# Patient Record
Sex: Female | Born: 1935 | Race: White | Hispanic: No | Marital: Married | Smoking: Never smoker
Health system: Southern US, Community
[De-identification: ages and names within clinical notes are randomized; demographics above are authoritative.]

## PROBLEM LIST (undated history)

## (undated) DIAGNOSIS — N6009 Solitary cyst of unspecified breast: Secondary | ICD-10-CM

## (undated) DIAGNOSIS — B029 Zoster without complications: Secondary | ICD-10-CM

## (undated) DIAGNOSIS — M810 Age-related osteoporosis without current pathological fracture: Secondary | ICD-10-CM

## (undated) DIAGNOSIS — M1990 Unspecified osteoarthritis, unspecified site: Secondary | ICD-10-CM

## (undated) DIAGNOSIS — G8929 Other chronic pain: Secondary | ICD-10-CM

## (undated) DIAGNOSIS — G629 Polyneuropathy, unspecified: Secondary | ICD-10-CM

## (undated) DIAGNOSIS — I10 Essential (primary) hypertension: Secondary | ICD-10-CM

## (undated) DIAGNOSIS — E785 Hyperlipidemia, unspecified: Secondary | ICD-10-CM

## (undated) DIAGNOSIS — M545 Low back pain: Secondary | ICD-10-CM

## (undated) DIAGNOSIS — F419 Anxiety disorder, unspecified: Secondary | ICD-10-CM

## (undated) DIAGNOSIS — N189 Chronic kidney disease, unspecified: Secondary | ICD-10-CM

## (undated) DIAGNOSIS — N183 Chronic kidney disease, stage 3 (moderate): Secondary | ICD-10-CM

## (undated) HISTORY — DX: Chronic kidney disease, stage 3 (moderate): N18.3

## (undated) HISTORY — DX: Other chronic pain: G89.29

## (undated) HISTORY — PX: LAMINECTOMY: SHX219

## (undated) HISTORY — PX: OTHER SURGICAL HISTORY: SHX169

## (undated) HISTORY — DX: Low back pain: M54.5

## (undated) HISTORY — DX: Solitary cyst of unspecified breast: N60.09

## (undated) HISTORY — PX: TOTAL VAGINAL HYSTERECTOMY: SHX2548

## (undated) HISTORY — DX: Zoster without complications: B02.9

## (undated) HISTORY — DX: Age-related osteoporosis without current pathological fracture: M81.0

## (undated) HISTORY — DX: Unspecified osteoarthritis, unspecified site: M19.90

## (undated) HISTORY — PX: CHOLECYSTECTOMY: SHX55

---

## 2003-08-12 LAB — HM COLONOSCOPY: HM COLON: NORMAL

## 2004-07-15 ENCOUNTER — Ambulatory Visit: Payer: Self-pay | Admitting: Unknown Physician Specialty

## 2004-10-17 ENCOUNTER — Ambulatory Visit: Payer: Self-pay | Admitting: General Practice

## 2004-10-28 ENCOUNTER — Ambulatory Visit: Payer: Self-pay | Admitting: General Practice

## 2009-09-13 ENCOUNTER — Ambulatory Visit: Payer: Self-pay | Admitting: Internal Medicine

## 2009-10-16 ENCOUNTER — Encounter: Admission: RE | Admit: 2009-10-16 | Discharge: 2009-10-16 | Payer: Self-pay | Admitting: Orthopedic Surgery

## 2009-10-29 ENCOUNTER — Encounter: Admission: RE | Admit: 2009-10-29 | Discharge: 2009-10-29 | Payer: Self-pay | Admitting: Orthopedic Surgery

## 2009-12-05 ENCOUNTER — Ambulatory Visit: Payer: Self-pay | Admitting: Unknown Physician Specialty

## 2012-10-05 LAB — HM MAMMOGRAPHY: HM MAMMO: NORMAL

## 2014-08-25 ENCOUNTER — Ambulatory Visit: Payer: Self-pay | Admitting: Physician Assistant

## 2014-11-29 LAB — BASIC METABOLIC PANEL
BUN: 29 mg/dL — AB (ref 4–21)
Creatinine: 1 mg/dL (ref <=1.1)

## 2014-11-29 LAB — LIPID PANEL
Cholesterol: 238 mg/dL — AB (ref 0–200)
HDL: 60 mg/dL (ref 35–70)
LDL Cholesterol: 154 mg/dL
Triglycerides: 121 mg/dL (ref 40–160)

## 2014-11-29 LAB — TSH: TSH: 2.4 u[IU]/mL (ref <=5.90)

## 2014-11-29 LAB — CBC AND DIFFERENTIAL: HEMOGLOBIN: 12.8 g/dL (ref 12.0–16.0)

## 2015-03-28 ENCOUNTER — Encounter: Payer: Self-pay | Admitting: Internal Medicine

## 2015-06-04 ENCOUNTER — Other Ambulatory Visit: Payer: Self-pay | Admitting: Internal Medicine

## 2015-06-04 ENCOUNTER — Encounter: Payer: Self-pay | Admitting: Internal Medicine

## 2015-06-04 DIAGNOSIS — G629 Polyneuropathy, unspecified: Secondary | ICD-10-CM

## 2015-06-04 DIAGNOSIS — M150 Primary generalized (osteo)arthritis: Secondary | ICD-10-CM

## 2015-06-04 DIAGNOSIS — I10 Essential (primary) hypertension: Secondary | ICD-10-CM | POA: Insufficient documentation

## 2015-06-04 DIAGNOSIS — M159 Polyosteoarthritis, unspecified: Secondary | ICD-10-CM | POA: Insufficient documentation

## 2015-06-04 DIAGNOSIS — M62838 Other muscle spasm: Secondary | ICD-10-CM | POA: Insufficient documentation

## 2015-06-04 DIAGNOSIS — E782 Mixed hyperlipidemia: Secondary | ICD-10-CM | POA: Insufficient documentation

## 2015-06-04 DIAGNOSIS — M778 Other enthesopathies, not elsewhere classified: Secondary | ICD-10-CM | POA: Insufficient documentation

## 2015-06-04 DIAGNOSIS — G8929 Other chronic pain: Secondary | ICD-10-CM

## 2015-06-04 HISTORY — DX: Polyneuropathy, unspecified: G62.9

## 2015-06-04 HISTORY — DX: Other chronic pain: G89.29

## 2015-06-10 ENCOUNTER — Ambulatory Visit (INDEPENDENT_AMBULATORY_CARE_PROVIDER_SITE_OTHER): Payer: Medicare Other | Admitting: Internal Medicine

## 2015-06-10 ENCOUNTER — Ambulatory Visit
Admission: RE | Admit: 2015-06-10 | Discharge: 2015-06-10 | Disposition: A | Discharge status: Home / Self Care | Payer: Medicare Other | Source: Ambulatory Visit | Attending: Internal Medicine | Admitting: Internal Medicine

## 2015-06-10 ENCOUNTER — Encounter: Payer: Self-pay | Admitting: Internal Medicine

## 2015-06-10 VITALS — BP 120/64 | HR 64 | Ht 65.0 in | Wt 211.6 lb

## 2015-06-10 DIAGNOSIS — M25512 Pain in left shoulder: Principal | ICD-10-CM

## 2015-06-10 DIAGNOSIS — M25511 Pain in right shoulder: Secondary | ICD-10-CM | POA: Diagnosis not present

## 2015-06-10 DIAGNOSIS — Z23 Encounter for immunization: Secondary | ICD-10-CM

## 2015-06-10 DIAGNOSIS — G5601 Carpal tunnel syndrome, right upper limb: Secondary | ICD-10-CM

## 2015-06-10 MED ORDER — METHYLPREDNISOLONE 4 MG PO TBPK: ORAL_TABLET | ORAL | Status: DC

## 2015-06-10 NOTE — Patient Instructions (Signed)
May take Aleve twice a day  And supplement with Tylenol 500 mg three times day

## 2015-06-10 NOTE — Progress Notes (Signed)
Date:  06/10/2015   [facility]   DOB:  1930-09-08   MRN:  [id number]   Chief Complaint: Arthritis  Patient complains of bilateral shoulder pain. For 5 months ago she began to have pain in the left shoulder without a known injury she did have some stiffness in her neck which has improved. Now she has pain in the right shoulder for the past month, again with no injury. She's been taking Aleve to 20 mg once a day without much benefit. She also complains of numbness in her right hand that wakes her from sleep. Her hand also becomes numb while driving a car. She denies any weakness or injury. No history of carpal tunnel syndrome.  Review of Systems  Constitutional: Negative for fever and fatigue.  HENT: Negative for sore throat.   Respiratory: Negative for cough and shortness of breath.   Cardiovascular: Negative for chest pain and palpitations.  Gastrointestinal: Negative for abdominal pain.  Musculoskeletal: Positive for joint swelling, arthralgias and neck stiffness. Negative for gait problem.  Neurological: Positive for numbness (in right hand). Negative for tremors, weakness, light-headedness and headaches.  Hematological: Negative for adenopathy.  Psychiatric/Behavioral: Negative for sleep disturbance and dysphoric mood.    Patient Active Problem List   Diagnosis Date Noted  . Chronic pain 06/04/2015  . Essential (primary) hypertension 06/04/2015  . Combined fat and carbohydrate induced hyperlipemia 06/04/2015  . Muscle spasms of head and/or neck 06/04/2015  . Neuropathy (HCC) 06/04/2015  . Generalized OA 06/04/2015  . Tendinitis of wrist 06/04/2015    Prior to Admission medications   Medication Sig Start Date End Date Taking? Authorizing Provider  Alpha-Lipoic Acid 300 MG TABS Take by mouth.   Yes Historical Provider, MD  Calcium Carb-Cholecalciferol (CALCIUM PLUS VITAMIN D3) 600-500 MG-UNIT CAPS Take by mouth.   Yes Historical Provider, MD  Coenzyme Q10  (COQ10) 200 MG CAPS Take by mouth.   Yes Historical Provider, MD  diclofenac sodium (VOLTAREN) 1 % GEL Place onto the skin. 10/05/14  Yes Historical Provider, MD  lisinopril-hydrochlorothiazide (PRINZIDE,ZESTORETIC) 20-25 MG tablet Take 1 tablet by mouth daily. 12/05/14  Yes Historical Provider, MD  naproxen sodium (ALEVE) 220 MG tablet Take 1 tablet by mouth 2 (two) times daily.   Yes Historical Provider, MD  Omega-3 Fatty Acids (FISH OIL BURP-LESS) 1000 MG CAPS Take 1 capsule by mouth daily.   Yes Historical Provider, MD  cyclobenzaprine (FLEXERIL) 5 MG tablet Take 1 tablet by mouth at bedtime. 05/11/14   Historical Provider, MD    Allergies  Allergen Reactions  . Celecoxib Swelling  . Pregabalin     Other reaction(s): Dizziness    Past Surgical History  Procedure Laterality Date  . Cholecystectomy    . Laminectomy    . Total vaginal hysterectomy    . Breast biopsy Left     Social History  Substance Use Topics  . Smoking status: Never Smoker   . Smokeless tobacco: None  . Alcohol Use: None     Medication list has been reviewed and updated.   Physical Exam  Constitutional: She is oriented to person, place, and time. She appears well-developed. No distress.  HENT:  Head: Normocephalic and atraumatic.  Eyes: Conjunctivae are normal. Right eye exhibits no discharge. Left eye exhibits no discharge. No scleral icterus.  Neck: Normal range of motion.  Cardiovascular: Normal rate, regular rhythm and normal heart sounds.   Pulmonary/Chest: Effort normal and breath sounds normal. No respiratory distress. She has no wheezes.  Musculoskeletal: She exhibits no edema.       Right shoulder: She exhibits decreased range of motion and tenderness. She exhibits no swelling, no effusion and no crepitus.       Left shoulder: She exhibits decreased range of motion and tenderness. She exhibits no bony tenderness, no swelling, no effusion and no crepitus.       Right wrist: Normal.  Tinel's and  Phalen's sign negative on the right  Neurological: She is alert and oriented to person, place, and time. She has normal strength and normal reflexes.  Skin: Skin is warm and dry. No rash noted.  Psychiatric: She has a normal mood and affect. Her behavior is normal. Thought content normal.  Nursing note and vitals reviewed.   BP 120/64 mmHg  Pulse 64  Ht  (1.651 m)  Wt 211 lb 9.6 oz (95.981 kg)  BMI 35.21 kg/m2  Assessment and Plan: 1. Shoulder pain, bilateral Suspect primarily muscular but will get x-rays to rule out joint abnormality If no response to prednisone taper would refer to orthopedics - DG Shoulder Right; Future - DG Shoulder Left; Future - methylPREDNISolone (MEDROL DOSEPAK) 4 MG TBPK tablet; Take 6 pills on day 1 the 5 pills day 2 then 4 pills day 3 then 3 pills day 4 then 2 pills day 5 then one pills day 6 then stop  Dispense: 21 tablet; Refill: 0  2. Carpal tunnel syndrome on right May benefit from prednisone taper - methylPREDNISolone (MEDROL DOSEPAK) 4 MG TBPK tablet; Take 6 pills on day 1 the 5 pills day 2 then 4 pills day 3 then 3 pills day 4 then 2 pills day 5 then one pills day 6 then stop  Dispense: 21 tablet; Refill: 0  3. Flu vaccine need - Flu Vaccine QUAD 36+ mos PF IM (Fluarix & Fluzone Quad PF)   Bari Edward, MD [facility]  06/10/2015

## 2015-12-16 ENCOUNTER — Other Ambulatory Visit: Payer: Self-pay | Admitting: Internal Medicine

## 2015-12-16 NOTE — Telephone Encounter (Signed)
Pt is scheduled for May 11 @ 930

## 2015-12-19 ENCOUNTER — Other Ambulatory Visit: Payer: Self-pay

## 2015-12-19 ENCOUNTER — Encounter: Payer: Self-pay | Admitting: Internal Medicine

## 2015-12-19 ENCOUNTER — Ambulatory Visit (INDEPENDENT_AMBULATORY_CARE_PROVIDER_SITE_OTHER): Payer: Medicare Other | Admitting: Internal Medicine

## 2015-12-19 VITALS — BP 130/90 | HR 59 | Temp 98.3°F | Resp 16 | Ht 65.0 in | Wt 209.0 lb

## 2015-12-19 DIAGNOSIS — G629 Polyneuropathy, unspecified: Secondary | ICD-10-CM | POA: Diagnosis not present

## 2015-12-19 DIAGNOSIS — Z0000 Encounter for general adult medical examination without abnormal findings: Secondary | ICD-10-CM | POA: Diagnosis not present

## 2015-12-19 DIAGNOSIS — M159 Polyosteoarthritis, unspecified: Secondary | ICD-10-CM

## 2015-12-19 DIAGNOSIS — E782 Mixed hyperlipidemia: Secondary | ICD-10-CM

## 2015-12-19 DIAGNOSIS — E2839 Other primary ovarian failure: Secondary | ICD-10-CM | POA: Diagnosis not present

## 2015-12-19 DIAGNOSIS — I10 Essential (primary) hypertension: Secondary | ICD-10-CM

## 2015-12-19 LAB — POCT URINALYSIS DIPSTICK
Bilirubin, UA: NEGATIVE
GLUCOSE UA: NEGATIVE
Ketones, UA: NEGATIVE
NITRITE UA: POSITIVE
PROTEIN UA: NEGATIVE
RBC UA: NEGATIVE
Spec Grav, UA: 1.02
UROBILINOGEN UA: 0.2
pH, UA: 6

## 2015-12-19 NOTE — Progress Notes (Signed)
Patient: Sherry Morales, Female    DOB: [birth year], 82 y.o.   MRN: [id number] Visit Date: 12/19/2015  Today's Provider: Bari Edward, MD   Chief Complaint  Patient presents with  . Medicare Wellness   Subjective:    Annual wellness visit Sherry Morales is a 82 y.o. female who presents today for her Subsequent Annual Wellness Visit. She feels fairly well. She reports exercising none. She reports she is sleeping fairly well.   ----------------------------------------------------------- Hypertension This is a chronic problem. The current episode started more than 1 year ago. The problem is unchanged. The problem is controlled. Pertinent negatives include no chest pain, headaches, palpitations or shortness of breath.  Arthritis Knee and hands - she is having more problems with her hands and her left knee.  She is considering artificial joint fluid injections.  She continues on tylenol and aleve as needed.  Review of Systems  Constitutional: Negative for fever, chills and fatigue.  HENT: Positive for hearing loss. Negative for congestion, tinnitus, trouble swallowing and voice change.   Eyes: Negative for visual disturbance.  Respiratory: Negative for cough, chest tightness, shortness of breath and wheezing.   Cardiovascular: Negative for chest pain, palpitations and leg swelling.  Gastrointestinal: Negative for vomiting, abdominal pain, diarrhea and constipation.  Endocrine: Negative for polydipsia and polyuria.  Genitourinary: Negative for dysuria, frequency, hematuria, vaginal bleeding, vaginal discharge and genital sores.  Musculoskeletal: Positive for myalgias, arthralgias and gait problem. Negative for joint swelling.  Skin: Negative for color change and rash.  Allergic/Immunologic: Positive for environmental allergies. Negative for food allergies.  Neurological: Negative for dizziness, tremors, light-headedness and headaches.  Hematological: Negative for  adenopathy. Does not bruise/bleed easily.  Psychiatric/Behavioral: Negative for sleep disturbance and dysphoric mood. The patient is not nervous/anxious.     Social History   Social History  . Marital Status: Married    Spouse Name: N/A  . Number of Children: N/A  . Years of Education: N/A   Occupational History  . Not on file.   Social History Main Topics  . Smoking status: Never Smoker   . Smokeless tobacco: Not on file  . Alcohol Use: Not on file  . Drug Use: Not on file  . Sexual Activity: Not on file   Other Topics Concern  . Not on file   Social History Narrative    Patient Active Problem List   Diagnosis Date Noted  . Chronic pain 06/04/2015  . Essential (primary) hypertension 06/04/2015  . Combined fat and carbohydrate induced hyperlipemia 06/04/2015  . Muscle spasms of head and/or neck 06/04/2015  . Neuropathy (HCC) 06/04/2015  . Generalized OA 06/04/2015  . Tendinitis of wrist 06/04/2015    Past Surgical History  Procedure Laterality Date  . Cholecystectomy    . Laminectomy    . Total vaginal hysterectomy    . Breast biopsy Left     Her family history includes CAD in her father; Diabetes in her sister.    Previous Medications   ALPHA-LIPOIC ACID 300 MG TABS    Take by mouth.   CALCIUM CARB-CHOLECALCIFEROL (CALCIUM PLUS VITAMIN D3) 600-500 MG-UNIT CAPS    Take by mouth.   COENZYME Q10 (COQ10) 200 MG CAPS    Take by mouth.   DICLOFENAC SODIUM (VOLTAREN) 1 % GEL    Place onto the skin.   LISINOPRIL-HYDROCHLOROTHIAZIDE (PRINZIDE,ZESTORETIC) 20-25 MG TABLET    TAKE ONE TABLET BY MOUTH ONCE DAILY   MAGNESIUM 200 MG TABS    Take by mouth.  NAPROXEN SODIUM (ALEVE) 220 MG TABLET    Take 1 tablet by mouth daily.    OMEGA-3 FATTY ACIDS (FISH OIL BURP-LESS) 1000 MG CAPS    Take 1 capsule by mouth daily.    Patient Care Team: Reubin MilanLaura H Treyvonne Tata, MD as PCP - General (Family Medicine)     Objective:   Vitals: BP 130/90 mmHg  Pulse 59  Temp(Src) 98.3 F  (36.8 C)  Resp 16  Ht 5\' 5"  (1.651 m)  Wt 209 lb (94.802 kg)  BMI 34.78 kg/m2  SpO2 95%  Physical Exam  Constitutional: She is oriented to person, place, and time. She appears well-developed and well-nourished. No distress.  HENT:  Head: Normocephalic and atraumatic.  Right Ear: Tympanic membrane and ear canal normal.  Left Ear: Tympanic membrane and ear canal normal.  Nose: Right sinus exhibits no maxillary sinus tenderness. Left sinus exhibits no maxillary sinus tenderness.  Mouth/Throat: Uvula is midline and oropharynx is clear and moist.  Eyes: Conjunctivae and EOM are normal. Right eye exhibits no discharge. Left eye exhibits no discharge. No scleral icterus.  Neck: Normal range of motion. Carotid bruit is not present. No erythema present. No thyromegaly present.  Cardiovascular: Normal rate, regular rhythm, normal heart sounds and normal pulses.   Pulmonary/Chest: Effort normal. No respiratory distress. She has no wheezes. Right breast exhibits no mass, no nipple discharge, no skin change and no tenderness. Left breast exhibits no mass, no nipple discharge, no skin change and no tenderness.  Abdominal: Soft. Bowel sounds are normal. There is no hepatosplenomegaly. There is no tenderness. There is no CVA tenderness.  Musculoskeletal:       Right knee: She exhibits decreased range of motion and swelling.       Left knee: She exhibits decreased range of motion and swelling.  Lymphadenopathy:    She has no cervical adenopathy.    She has no axillary adenopathy.  Neurological: She is alert and oriented to person, place, and time. She has normal reflexes. No cranial nerve deficit or sensory deficit.  Skin: Skin is warm, dry and intact. No rash noted.  Scattered echymoses  Psychiatric: She has a normal mood and affect. Her speech is normal and behavior is normal. Thought content normal.  Nursing note and vitals reviewed.   Activities of Daily Living In your present state of health,  do you have any difficulty performing the following activities: 12/19/2015 06/10/2015  Hearing? N N  Vision? N N  Difficulty concentrating or making decisions? N N  Walking or climbing stairs? Y Y  Dressing or bathing? N Y  Doing errands, shopping? N N  Preparing Food and eating ? N -  Using the Toilet? N -  In the past six months, have you accidently leaked urine? N -  Do you have problems with loss of bowel control? N -  Managing your Medications? N -  Managing your Finances? N -  Housekeeping or managing your Housekeeping? N -    Fall Risk Assessment Fall Risk  06/10/2015  Falls in the past year? No      Depression Screen PHQ 2/9 Scores 12/19/2015 06/10/2015  PHQ - 2 Score 2 1  PHQ- 9 Score 10 -    Cognitive Testing - 6-CIT   Correct? Score   What year is it? yes 0 Yes = 0    No = 4  What month is it? yes 0 Yes = 0    No = 3  Remember:  Floyde Parkins, [address], [place]     What time is it? yes 0 Yes = 0    No = 3  Count backwards from 20 to 1 yes 0 Correct = 0    1 error = 2   More than 1 error = 4  Say the months of the year in reverse. yes 0 Correct = 0    1 error = 2   More than 1 error = 4  What address did I ask you to remember? yes 0 Correct = 0  1 error = 2    2 error = 4    3 error = 6    4 error = 8    All wrong = 10       TOTAL SCORE  0/28   Interpretation:  Normal  Normal (0-7) Abnormal (8-28)        Medicare Annual Wellness Visit Summary:  Reviewed patient's Family Medical History Reviewed and updated list of patient's medical providers Assessment of cognitive impairment was done Assessed patient's functional ability Established a written schedule for health screening services Health Risk Assessent Completed and Reviewed  Exercise Activities and Dietary recommendations Goals    None      Immunization History  Administered Date(s) Administered  . Influenza,inj,Quad PF,36+ Mos 06/10/2015  . Pneumococcal Conjugate-13 11/29/2014     Health Maintenance  Topic Date Due  . TETANUS/TDAP  04/05/1952  . ZOSTAVAX  04/05/1993  . DEXA SCAN  04/05/1998  . PNA vac Low Risk Adult (2 of 2 - PPSV23) 11/29/2015  . INFLUENZA VACCINE  03/10/2016     Discussed health benefits of physical activity, and encouraged her to engage in regular exercise appropriate for her age and condition.    ------------------------------------------------------------------------------------------------------------   Assessment & Plan:  1. Medicare annual wellness visit, subsequent Measures satisfied Depression scale discussed - she attributes her answers to her current degree of OA pain and debility - POCT urinalysis dipstick  2. Essential (primary) hypertension controlled - CBC with Differential/Platelet - Comprehensive metabolic panel  3. Neuropathy (HCC) Improved with use of alpha-lipoic acid - TSH  4. Generalized OA Continue medications Consult Ortho regarding joint injections  5. Combined fat and carbohydrate induced hyperlipemia Continue healthy diet - Lipid panel  6. Ovarian failure Continue calcium + Vitamin D - DG Bone Density; Future   Bari Edward, MD [facility]  12/19/2015

## 2015-12-19 NOTE — Patient Instructions (Signed)
Health Maintenance  Topic Date Due  . TETANUS/TDAP  04/05/1952  . DEXA SCAN  04/05/1998  . PNA vac Low Risk Adult (2 of 2 - PPSV23) 11/29/2015  . INFLUENZA VACCINE  03/10/2016  . MAMMOGRAM  03/24/2016  . ZOSTAVAX  Addressed   Breast Self-Awareness Practicing breast self-awareness may pick up problems early, prevent significant medical complications, and possibly save your life. By practicing breast self-awareness, you can become familiar with how your breasts look and feel and if your breasts are changing. This allows you to notice changes early. It can also offer you some reassurance that your breast health is good. One way to learn what is normal for your breasts and whether your breasts are changing is to do a breast self-exam. If you find a lump or something that was not present in the past, it is best to contact your caregiver right away. Other findings that should be evaluated by your caregiver include nipple discharge, especially if it is bloody; skin changes or reddening; areas where the skin seems to be pulled in (retracted); or new lumps and bumps. Breast pain is seldom associated with cancer (malignancy), but should also be evaluated by a caregiver. HOW TO PERFORM A BREAST SELF-EXAM The best time to examine your breasts is 5-7 days after your menstrual period is over. During menstruation, the breasts are lumpier, and it may be more difficult to pick up changes. If you do not menstruate, have reached menopause, or had your uterus removed (hysterectomy), you should examine your breasts at regular intervals, such as monthly. If you are breastfeeding, examine your breasts after a feeding or after using a breast pump. Breast implants do not decrease the risk for lumps or tumors, so continue to perform breast self-exams as recommended. Talk to your caregiver about how to determine the difference between the implant and breast tissue. Also, talk about the amount of pressure you should use during the  exam. Over time, you will become more familiar with the variations of your breasts and more comfortable with the exam. A breast self-exam requires you to remove all your clothes above the waist. 1. Look at your breasts and nipples. Stand in front of a mirror in a room with good lighting. With your hands on your hips, push your hands firmly downward. Look for a difference in shape, contour, and size from one breast to the other (asymmetry). Asymmetry includes puckers, dips, or bumps. Also, look for skin changes, such as reddened or scaly areas on the breasts. Look for nipple changes, such as discharge, dimpling, repositioning, or redness. 2. Carefully feel your breasts. This is best done either in the shower or tub while using soapy water or when flat on your back. Place the arm (on the side of the breast you are examining) above your head. Use the pads (not the fingertips) of your three middle fingers on your opposite hand to feel your breasts. Start in the underarm area and use  inch (2 cm) overlapping circles to feel your breast. Use 3 different levels of pressure (light, medium, and firm pressure) at each circle before moving to the next circle. The light pressure is needed to feel the tissue closest to the skin. The medium pressure will help to feel breast tissue a little deeper, while the firm pressure is needed to feel the tissue close to the ribs. Continue the overlapping circles, moving downward over the breast until you feel your ribs below your breast. Then, move one finger-width towards the  center of the body. Continue to use the  inch (2 cm) overlapping circles to feel your breast as you move slowly up toward the collar bone (clavicle) near the base of the neck. Continue the up and down exam using all 3 pressures until you reach the middle of the chest. Do this with each breast, carefully feeling for lumps or changes. 3.  Keep a written record with breast changes or normal findings for each breast.  By writing this information down, you do not need to depend only on memory for size, tenderness, or location. Write down where you are in your menstrual cycle, if you are still menstruating. Breast tissue can have some lumps or thick tissue. However, see your caregiver if you find anything that concerns you.  SEEK MEDICAL CARE IF:  You see a change in shape, contour, or size of your breasts or nipples.   You see skin changes, such as reddened or scaly areas on the breasts or nipples.   You have an unusual discharge from your nipples.   You feel a new lump or unusually thick areas.    This information is not intended to replace advice given to you by your health care provider. Make sure you discuss any questions you have with your health care provider.   Document Released: 07/27/2005 Document Revised: 07/13/2012 Document Reviewed: 11/11/2011 Elsevier Interactive Patient Education [employer].

## 2015-12-20 ENCOUNTER — Encounter: Payer: Self-pay | Admitting: Internal Medicine

## 2015-12-20 DIAGNOSIS — N183 Chronic kidney disease, stage 3 unspecified: Secondary | ICD-10-CM | POA: Insufficient documentation

## 2015-12-20 LAB — COMPREHENSIVE METABOLIC PANEL
ALBUMIN: 4.6 g/dL (ref 3.5–4.7)
ALT: 15 IU/L (ref 0–32)
AST: 25 IU/L (ref 0–40)
Albumin/Globulin Ratio: 1.9 (ref 1.2–2.2)
Alkaline Phosphatase: 99 IU/L (ref 39–117)
BUN / CREAT RATIO: 27 (ref 12–28)
BUN: 28 mg/dL — AB (ref 8–27)
Bilirubin Total: 0.4 mg/dL (ref 0.0–1.2)
CALCIUM: 9.3 mg/dL (ref 8.7–10.3)
CO2: 23 mmol/L (ref 18–29)
CREATININE: 1.03 mg/dL — AB (ref 0.57–1.00)
Chloride: 100 mmol/L (ref 96–106)
GFR, EST AFRICAN AMERICAN: 58 mL/min/{1.73_m2} — AB (ref >59)
GFR, EST NON AFRICAN AMERICAN: 51 mL/min/{1.73_m2} — AB (ref >59)
GLUCOSE: 86 mg/dL (ref 65–99)
Globulin, Total: 2.4 g/dL (ref 1.5–4.5)
Potassium: 4.3 mmol/L (ref 3.5–5.2)
Sodium: 141 mmol/L (ref 134–144)
TOTAL PROTEIN: 7 g/dL (ref 6.0–8.5)

## 2015-12-20 LAB — CBC WITH DIFFERENTIAL/PLATELET
BASOS ABS: 0 10*3/uL (ref 0.0–0.2)
Basos: 0 %
EOS (ABSOLUTE): 0.3 10*3/uL (ref 0.0–0.4)
Eos: 4 %
Hematocrit: 37.8 % (ref 34.0–46.6)
Hemoglobin: 12.1 g/dL (ref 11.1–15.9)
IMMATURE GRANULOCYTES: 0 %
Immature Grans (Abs): 0 10*3/uL (ref 0.0–0.1)
LYMPHS: 27 %
Lymphocytes Absolute: 1.9 10*3/uL (ref 0.7–3.1)
MCH: 27.9 pg (ref 26.6–33.0)
MCHC: 32 g/dL (ref 31.5–35.7)
MCV: 87 fL (ref 79–97)
MONOS ABS: 0.6 10*3/uL (ref 0.1–0.9)
Monocytes: 8 %
NEUTROS PCT: 61 %
Neutrophils Absolute: 4.3 10*3/uL (ref 1.4–7.0)
Platelets: 276 10*3/uL (ref 150–379)
RBC: 4.33 x10E6/uL (ref 3.77–5.28)
RDW: 13.8 % (ref 12.3–15.4)
WBC: 7 10*3/uL (ref 3.4–10.8)

## 2015-12-20 LAB — TSH: TSH: 3.52 u[IU]/mL (ref 0.450–4.500)

## 2015-12-20 LAB — LIPID PANEL
CHOL/HDL RATIO: 4.8 ratio — AB (ref 0.0–4.4)
Cholesterol, Total: 233 mg/dL — ABNORMAL HIGH (ref 100–199)
HDL: 49 mg/dL (ref >39)
LDL Calculated: 156 mg/dL — ABNORMAL HIGH (ref 0–99)
Triglycerides: 141 mg/dL (ref 0–149)
VLDL CHOLESTEROL CAL: 28 mg/dL (ref 5–40)

## 2016-06-03 ENCOUNTER — Ambulatory Visit (INDEPENDENT_AMBULATORY_CARE_PROVIDER_SITE_OTHER): Payer: Medicare Other

## 2016-06-03 DIAGNOSIS — Z23 Encounter for immunization: Secondary | ICD-10-CM

## 2016-06-22 ENCOUNTER — Other Ambulatory Visit: Payer: Self-pay | Admitting: Internal Medicine

## 2016-06-25 NOTE — Telephone Encounter (Signed)
pts coming in on 6/5 for cpe/maw

## 2016-11-17 IMAGING — CR DG SHOULDER 2+V*L*
3 series · 3 of 3 positions shown · non-contrast
Comparison: None.

CLINICAL DATA: Left shoulder pain.

EXAM:
LEFT SHOULDER - 2+ VIEW

[shoulder axial (1 of 3)]
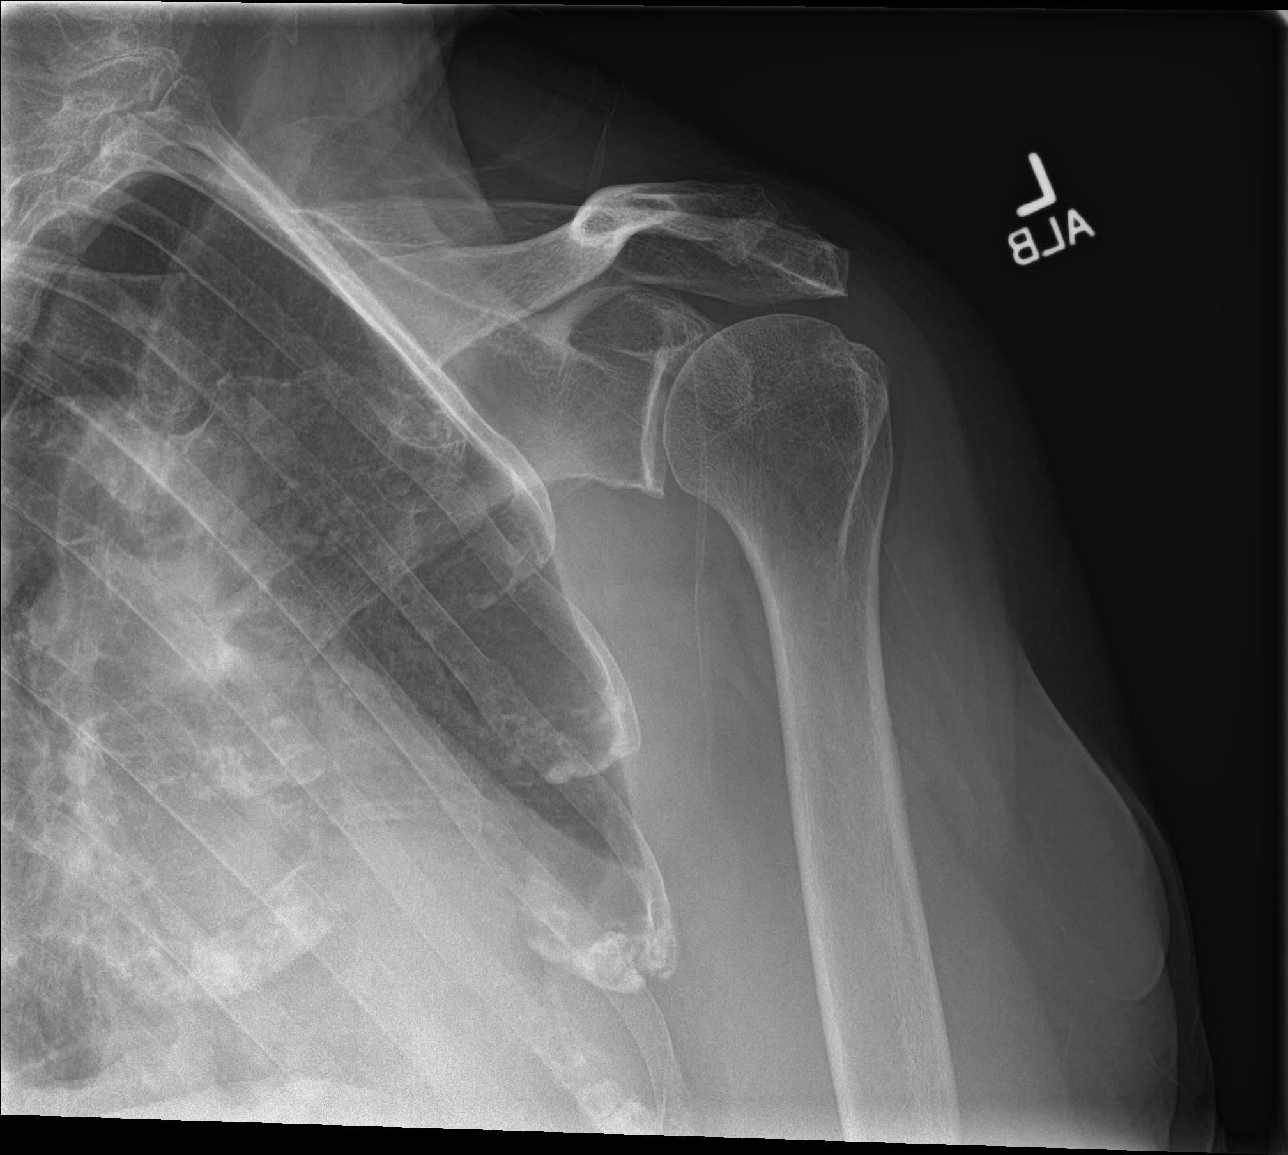

[shoulder axial (2 of 3)]
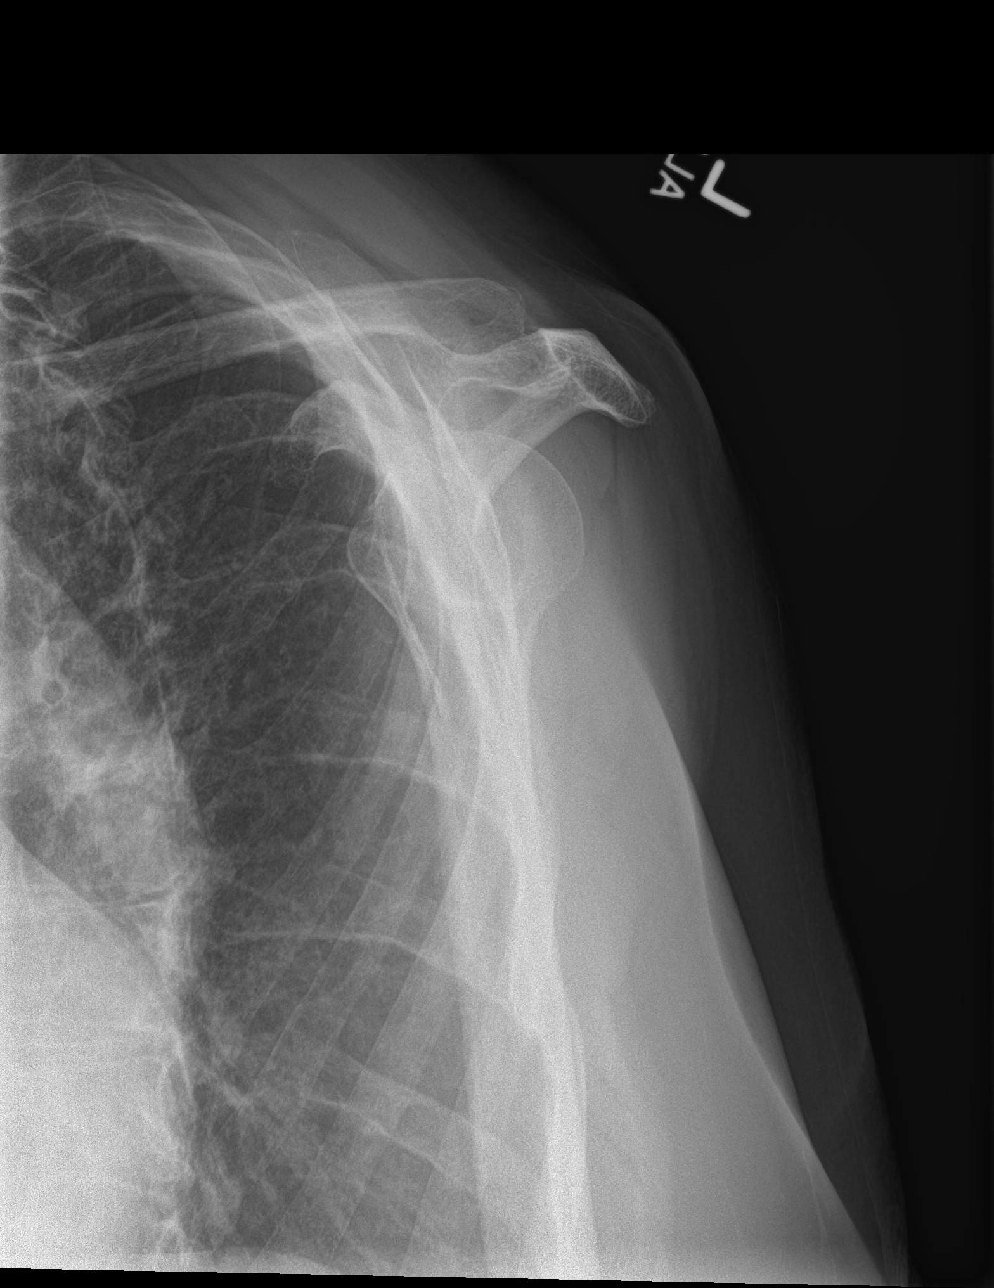

[shoulder axial (3 of 3)]
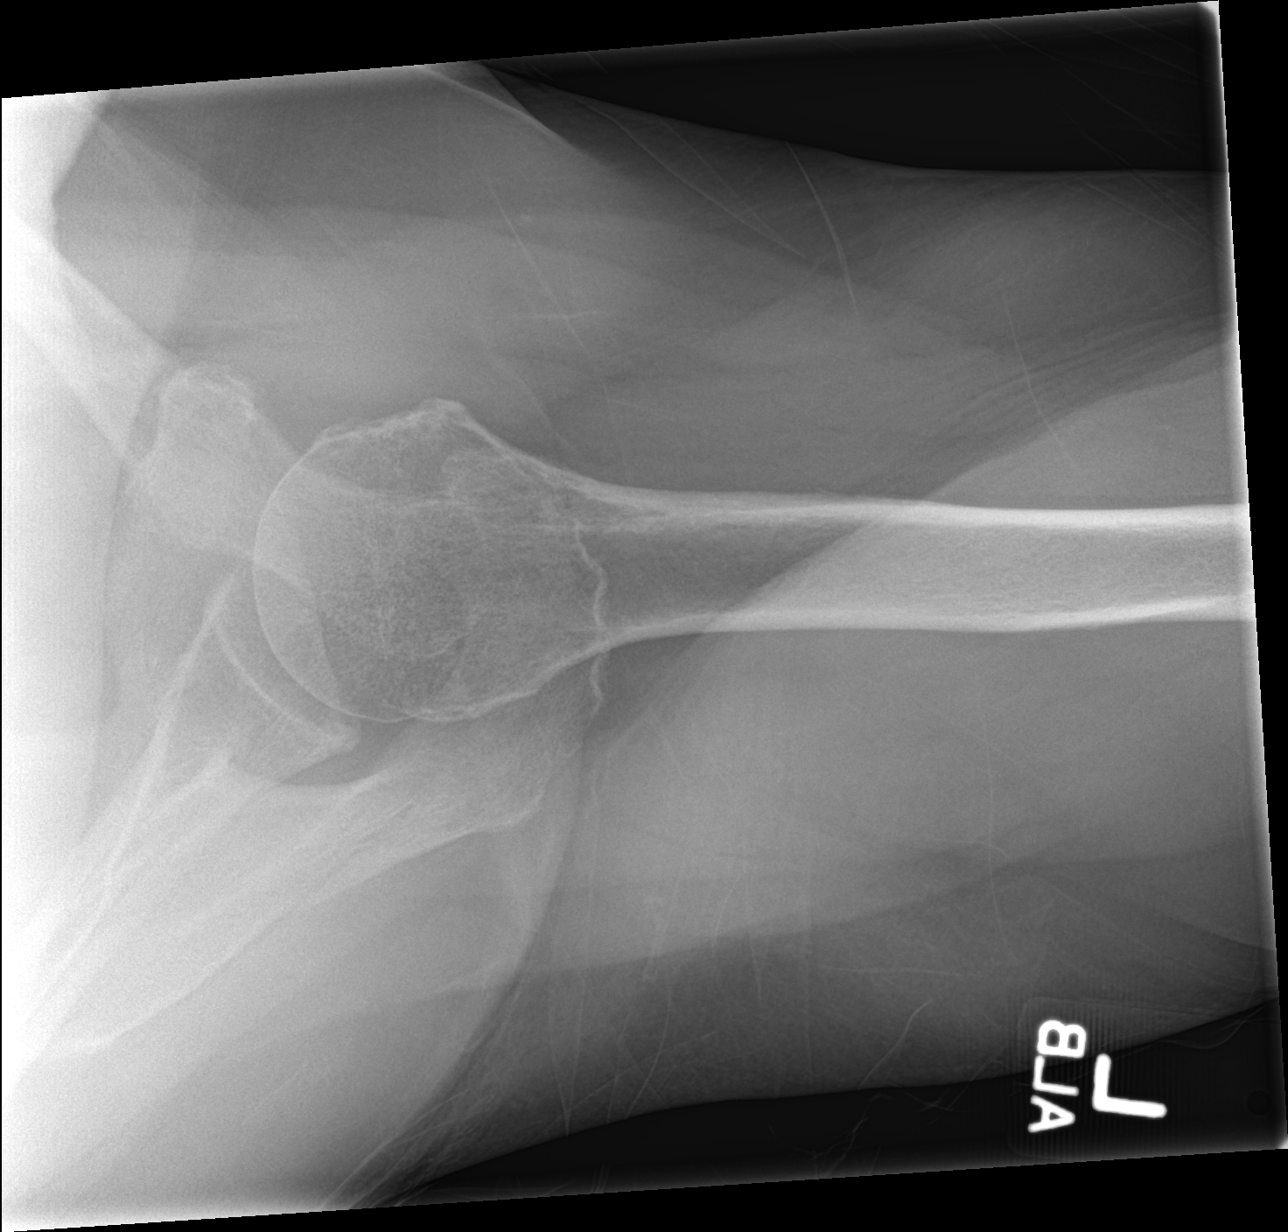

[3 of 3 positions shown; findings below may reference images not displayed]

FINDINGS: There is no evidence of fracture or dislocation. No bony lesions or
destruction. Mild degenerative changes are seen involving the AC
joint. Soft tissues are unremarkable.
IMPRESSION: No acute findings.  Mild degenerative disease of the AC joint.

## 2016-12-24 DIAGNOSIS — M545 Low back pain, unspecified: Secondary | ICD-10-CM

## 2016-12-24 DIAGNOSIS — N183 Chronic kidney disease, stage 3 unspecified: Secondary | ICD-10-CM

## 2016-12-24 HISTORY — DX: Chronic kidney disease, stage 3 unspecified: N18.30

## 2016-12-24 HISTORY — DX: Low back pain, unspecified: M54.50

## 2016-12-28 ENCOUNTER — Telehealth: Payer: Self-pay | Admitting: Internal Medicine

## 2016-12-28 NOTE — Telephone Encounter (Signed)
Called pt to schedule Annual Wellness Visit with Nurse Health Advisor for June:  - knb ° °

## 2017-01-01 ENCOUNTER — Other Ambulatory Visit: Payer: Self-pay | Admitting: Internal Medicine

## 2017-01-06 ENCOUNTER — Other Ambulatory Visit: Payer: Self-pay

## 2017-01-06 MED ORDER — LISINOPRIL-HYDROCHLOROTHIAZIDE 20-25 MG PO TABS: 1.0000 | ORAL_TABLET | Freq: Every day | ORAL | 0 refills | Status: DC

## 2017-01-12 ENCOUNTER — Ambulatory Visit (INDEPENDENT_AMBULATORY_CARE_PROVIDER_SITE_OTHER): Payer: Medicare Other | Admitting: Internal Medicine

## 2017-01-12 ENCOUNTER — Encounter: Payer: Self-pay | Admitting: Internal Medicine

## 2017-01-12 VITALS — BP 128/72 | HR 67 | Ht 65.0 in | Wt 206.0 lb

## 2017-01-12 DIAGNOSIS — E782 Mixed hyperlipidemia: Secondary | ICD-10-CM

## 2017-01-12 DIAGNOSIS — I10 Essential (primary) hypertension: Secondary | ICD-10-CM

## 2017-01-12 DIAGNOSIS — Z1239 Encounter for other screening for malignant neoplasm of breast: Secondary | ICD-10-CM

## 2017-01-12 DIAGNOSIS — N183 Chronic kidney disease, stage 3 unspecified: Secondary | ICD-10-CM

## 2017-01-12 DIAGNOSIS — Z0000 Encounter for general adult medical examination without abnormal findings: Secondary | ICD-10-CM

## 2017-01-12 DIAGNOSIS — M62838 Other muscle spasm: Secondary | ICD-10-CM | POA: Diagnosis not present

## 2017-01-12 LAB — POCT URINALYSIS DIPSTICK
Bilirubin, UA: NEGATIVE
Glucose, UA: NEGATIVE
Ketones, UA: NEGATIVE
Nitrite, UA: NEGATIVE
PH UA: 6 (ref 5.0–8.0)
Protein, UA: NEGATIVE
RBC UA: NEGATIVE
Spec Grav, UA: 1.015 (ref 1.010–1.025)
UROBILINOGEN UA: 0.2 U/dL

## 2017-01-12 MED ORDER — BACLOFEN 10 MG PO TABS: 10.0000 mg | ORAL_TABLET | Freq: Every day | ORAL | 0 refills | Status: DC

## 2017-01-12 MED ORDER — LISINOPRIL-HYDROCHLOROTHIAZIDE 20-25 MG PO TABS
1.0000 | ORAL_TABLET | Freq: Every day | ORAL | 3 refills | Status: DC
Start: 2017-01-12 — End: 2018-01-25

## 2017-01-12 NOTE — Patient Instructions (Signed)
Health Maintenance  Topic Date Due  . TETANUS/TDAP  04/05/1952  . DEXA SCAN  04/05/1998  . MAMMOGRAM  03/24/2016  . INFLUENZA VACCINE  03/10/2017  . PNA vac Low Risk Adult  Completed

## 2017-01-12 NOTE — Progress Notes (Signed)
Patient: Sherry Morales, Female    DOB: March 02, 1931, 83 y.o.   MRN: [id number] Visit Date: 01/12/2017  Today's Provider: Bari EdwardLaura Tamlyn Sides, MD   Chief Complaint  Patient presents with  . Medicare Wellness    Breast Exam.  . Hypertension   Subjective:    Annual wellness visit Sherry Morales is a 83 y.o. female who presents today for her Subsequent Annual Wellness Visit. She feels fairly well. She reports exercising none due to hip pain. She reports she is sleeping fairly well. [place]  ----------------------------------------------------------- Hypertension  This is a chronic problem. The problem is controlled. Pertinent negatives include no chest pain, headaches, palpitations or shortness of breath. Past treatments include ACE inhibitors and diuretics. The current treatment provides significant improvement. Hypertensive end-organ damage includes kidney disease.  Hip Pain   There was no injury mechanism. The pain is present in the right hip. The quality of the pain is described as aching and cramping. The pain is mild. The pain has been improving since onset. The symptoms are aggravated by weight bearing. The treatment provided moderate relief.  Seeing chiropractor for piriformis spasm and improving but would like to try a muscle relaxants as well.  Review of Systems  Constitutional: Negative for chills, fatigue, fever and unexpected weight change.  HENT: Negative for congestion, hearing loss, tinnitus, trouble swallowing and voice change.   Eyes: Negative for visual disturbance.  Respiratory: Negative for cough, chest tightness, shortness of breath and wheezing.   Cardiovascular: Negative for chest pain, palpitations and leg swelling.  Gastrointestinal: Negative for abdominal pain, constipation, diarrhea and vomiting.  Endocrine: Negative for polydipsia and polyuria.  Genitourinary: Negative for dysuria, frequency, genital sores, vaginal  bleeding and vaginal discharge.  Musculoskeletal: Positive for arthralgias and gait problem. Negative for joint swelling.  Skin: Negative for color change and rash.  Neurological: Negative for dizziness, tremors, light-headedness and headaches.  Hematological: Negative for adenopathy. Does not bruise/bleed easily.  Psychiatric/Behavioral: Negative for dysphoric mood and sleep disturbance. The patient is not nervous/anxious.     Social History   Social History  . Marital status: Married    Spouse name: N/A  . Number of children: N/A  . Years of education: N/A   Occupational History  . Not on file.   Social History Main Topics  . Smoking status: Never Smoker  . Smokeless tobacco: Never Used  . Alcohol use No  . Drug use: Unknown  . Sexual activity: Not on file   Other Topics Concern  . Not on file   Social History Narrative  . No narrative on file    Patient Active Problem List   Diagnosis Date Noted  . CKD (chronic kidney disease), stage III 12/20/2015  . Chronic pain 06/04/2015  . Essential (primary) hypertension 06/04/2015  . Combined fat and carbohydrate induced hyperlipemia 06/04/2015  . Muscle spasms of head and/or neck 06/04/2015  . Neuropathy 06/04/2015  . Generalized OA 06/04/2015  . Tendinitis of wrist 06/04/2015    Past Surgical History:  Procedure Laterality Date  . Breast biopsy Left   . CHOLECYSTECTOMY    . LAMINECTOMY    . TOTAL VAGINAL HYSTERECTOMY      Her family history includes CAD in her father; Diabetes in her sister.     Previous Medications   ALPHA-LIPOIC ACID 300 MG TABS    Take by mouth.   COENZYME Q10 (COQ10) 200 MG CAPS    Take by mouth.   LISINOPRIL-HYDROCHLOROTHIAZIDE (PRINZIDE,ZESTORETIC) 20-25  MG TABLET    Take 1 tablet by mouth daily.   MAGNESIUM 200 MG TABS    Take by mouth.   NAPROXEN SODIUM (ALEVE) 220 MG TABLET    Take 1 tablet by mouth daily.    OMEGA-3 FATTY ACIDS (FISH OIL BURP-LESS) 1000 MG CAPS    Take 1 capsule by  mouth daily.   S-ADENOSYLMETHIONINE (SAM-E) 400 MG TABS    Take 2 tablets by mouth daily.     Patient Care Team: Reubin Milan, MD as PCP - General (Family Medicine)      Objective:   Vitals: BP 128/72 (BP Location: Right Arm, Patient Position: Sitting, Cuff Size: Normal)   Pulse 67   Ht 5\' 5"  (1.651 m)   Wt 206 lb (93.4 kg)   SpO2 97%   BMI 34.28 kg/m   Physical Exam  Constitutional: She is oriented to person, place, and time. She appears well-developed and well-nourished. No distress.  HENT:  Head: Normocephalic and atraumatic.  Right Ear: Tympanic membrane and ear canal normal.  Left Ear: Tympanic membrane and ear canal normal.  Nose: Right sinus exhibits no maxillary sinus tenderness. Left sinus exhibits no maxillary sinus tenderness.  Mouth/Throat: Uvula is midline and oropharynx is clear and moist.  Eyes: Conjunctivae and EOM are normal. Right eye exhibits no discharge. Left eye exhibits no discharge. No scleral icterus.  Neck: Normal range of motion. Carotid bruit is not present. No erythema present. No thyromegaly present.  Cardiovascular: Normal rate, regular rhythm, normal heart sounds and normal pulses.   Pulmonary/Chest: Effort normal. No respiratory distress. She has no decreased breath sounds. She has no wheezes. Right breast exhibits no mass, no nipple discharge, no skin change and no tenderness. Left breast exhibits skin change (bruise inner lower quadrant). Left breast exhibits no mass, no nipple discharge and no tenderness.  Abdominal: Soft. Bowel sounds are normal. There is no hepatosplenomegaly. There is no tenderness. There is no CVA tenderness.  Musculoskeletal:       Right hip: She exhibits decreased range of motion.       Lumbar back: She exhibits tenderness and spasm.  Lymphadenopathy:    She has no cervical adenopathy.    She has no axillary adenopathy.  Neurological: She is alert and oriented to person, place, and time. She has normal reflexes. No  cranial nerve deficit or sensory deficit.  Skin: Skin is warm, dry and intact. No rash noted.  Psychiatric: She has a normal mood and affect. Her speech is normal and behavior is normal. Thought content normal.  Nursing note and vitals reviewed.   Activities of Daily Living In your present state of health, do you have any difficulty performing the following activities: 01/12/2017  Hearing? N  Vision? N  Difficulty concentrating or making decisions? N  Walking or climbing stairs? N  Dressing or bathing? N  Doing errands, shopping? N  Preparing Food and eating ? N  Using the Toilet? N  In the past six months, have you accidently leaked urine? Y  Do you have problems with loss of bowel control? N  Managing your Medications? N  Managing your Finances? N  Housekeeping or managing your Housekeeping? N  Some recent data might be hidden    Fall Risk Assessment Fall Risk  01/12/2017 06/10/2015  Falls in the past year? Yes No  Number falls in past yr: 1 -  Injury with Fall? No -  Follow up Falls evaluation completed -  Depression Screen PHQ 2/9 Scores 01/12/2017 12/19/2015 06/10/2015  PHQ - 2 Score 3 2 1   PHQ- 9 Score 4 10 -   6CIT Screen 01/12/2017  What Year? 0 points  What month? 0 points  What time? 0 points  Count back from 20 0 points  Months in reverse 0 points  Repeat phrase 0 points  Total Score 0    Medicare Annual Wellness Visit Summary:  Reviewed patient's Family Medical History Reviewed and updated list of patient's medical providers Assessment of cognitive impairment was done Assessed patient's functional ability Established a written schedule for health screening services Health Risk Assessent Completed and Reviewed  Exercise Activities and Dietary recommendations Goals    None      Immunization History  Administered Date(s) Administered  . Influenza,inj,Quad PF,36+ Mos 06/10/2015, 06/03/2016  . Pneumococcal Conjugate-13 11/29/2014    Health  Maintenance  Topic Date Due  . TETANUS/TDAP  04/05/1952  . DEXA SCAN  04/05/1998  . PNA vac Low Risk Adult (2 of 2 - PPSV23) 11/29/2015  . MAMMOGRAM  03/24/2016  . INFLUENZA VACCINE  03/10/2017    Discussed health benefits of physical activity, and encouraged her to engage in regular exercise appropriate for her age and condition.    ------------------------------------------------------------------------------------------------------------  Assessment & Plan:   1. Medicare annual wellness visit, subsequent Measures satisfied  2. Essential (primary) hypertension controlled - lisinopril-hydrochlorothiazide (PRINZIDE,ZESTORETIC) 20-25 MG tablet; Take 1 tablet by mouth daily.  Dispense: 90 tablet; Refill: 3 - CBC with Differential/Platelet - TSH - POCT urinalysis dipstick  3. CKD (chronic kidney disease), stage III Continue to monitor and limit use of nsaids - Comprehensive metabolic panel  4. Combined fat and carbohydrate induced hyperlipemia Continue low fat diet - Lipid panel  5. Encounter for special screening examination for neoplasm of breast Pt to schedule in [place] - MM DIGITAL SCREENING BILATERAL  6. Muscle spasm Continue ice, chiropractor and add baclofen at hs - baclofen (LIORESAL) 10 MG tablet; Take 1 tablet (10 mg total) by mouth at bedtime.  Dispense: 90 each; Refill: 0   Meds ordered this encounter  Medications  . lisinopril-hydrochlorothiazide (PRINZIDE,ZESTORETIC) 20-25 MG tablet    Sig: Take 1 tablet by mouth daily.    Dispense:  90 tablet    Refill:  3  . baclofen (LIORESAL) 10 MG tablet    Sig: Take 1 tablet (10 mg total) by mouth at bedtime.    Dispense:  90 each    Refill:  0    Bari Edward, MD [facility]  01/12/2017

## 2017-01-13 LAB — COMPREHENSIVE METABOLIC PANEL
ALBUMIN: 4.7 g/dL (ref 3.5–4.7)
ALK PHOS: 102 IU/L (ref 39–117)
ALT: 18 IU/L (ref 0–32)
AST: 27 IU/L (ref 0–40)
Albumin/Globulin Ratio: 2 (ref 1.2–2.2)
BILIRUBIN TOTAL: 0.4 mg/dL (ref 0.0–1.2)
BUN / CREAT RATIO: 22 (ref 12–28)
BUN: 24 mg/dL (ref 8–27)
CHLORIDE: 100 mmol/L (ref 96–106)
CO2: 26 mmol/L (ref 18–29)
CREATININE: 1.08 mg/dL — AB (ref 0.57–1.00)
Calcium: 9.4 mg/dL (ref 8.7–10.3)
GFR calc Af Amer: 55 mL/min/{1.73_m2} — ABNORMAL LOW (ref >59)
GFR calc non Af Amer: 48 mL/min/{1.73_m2} — ABNORMAL LOW (ref >59)
GLOBULIN, TOTAL: 2.3 g/dL (ref 1.5–4.5)
GLUCOSE: 94 mg/dL (ref 65–99)
Potassium: 4.6 mmol/L (ref 3.5–5.2)
SODIUM: 141 mmol/L (ref 134–144)
Total Protein: 7 g/dL (ref 6.0–8.5)

## 2017-01-13 LAB — CBC WITH DIFFERENTIAL/PLATELET
BASOS ABS: 0 10*3/uL (ref 0.0–0.2)
Basos: 0 %
EOS (ABSOLUTE): 0.2 10*3/uL (ref 0.0–0.4)
Eos: 3 %
HEMATOCRIT: 37.7 % (ref 34.0–46.6)
Hemoglobin: 12.4 g/dL (ref 11.1–15.9)
Immature Grans (Abs): 0 10*3/uL (ref 0.0–0.1)
Immature Granulocytes: 0 %
LYMPHS ABS: 1.7 10*3/uL (ref 0.7–3.1)
Lymphs: 24 %
MCH: 28.6 pg (ref 26.6–33.0)
MCHC: 32.9 g/dL (ref 31.5–35.7)
MCV: 87 fL (ref 79–97)
MONOS ABS: 0.5 10*3/uL (ref 0.1–0.9)
Monocytes: 7 %
NEUTROS ABS: 4.6 10*3/uL (ref 1.4–7.0)
Neutrophils: 66 %
Platelets: 278 10*3/uL (ref 150–379)
RBC: 4.33 x10E6/uL (ref 3.77–5.28)
RDW: 14.2 % (ref 12.3–15.4)
WBC: 7 10*3/uL (ref 3.4–10.8)

## 2017-01-13 LAB — LIPID PANEL
CHOLESTEROL TOTAL: 234 mg/dL — AB (ref 100–199)
Chol/HDL Ratio: 4.6 ratio — ABNORMAL HIGH (ref 0.0–4.4)
HDL: 51 mg/dL (ref >39)
LDL CALC: 158 mg/dL — AB (ref 0–99)
TRIGLYCERIDES: 125 mg/dL (ref 0–149)
VLDL Cholesterol Cal: 25 mg/dL (ref 5–40)

## 2017-01-13 LAB — TSH: TSH: 2.83 u[IU]/mL (ref 0.450–4.500)

## 2017-03-26 DIAGNOSIS — M1611 Unilateral primary osteoarthritis, right hip: Secondary | ICD-10-CM | POA: Insufficient documentation

## 2017-05-28 ENCOUNTER — Ambulatory Visit (INDEPENDENT_AMBULATORY_CARE_PROVIDER_SITE_OTHER): Payer: Medicare Other

## 2017-05-28 DIAGNOSIS — Z23 Encounter for immunization: Secondary | ICD-10-CM

## 2017-07-14 ENCOUNTER — Ambulatory Visit: Payer: Medicare Other | Admitting: Internal Medicine

## 2017-07-14 ENCOUNTER — Other Ambulatory Visit: Payer: Self-pay | Admitting: Internal Medicine

## 2017-07-14 ENCOUNTER — Encounter: Payer: Self-pay | Admitting: Internal Medicine

## 2017-07-14 VITALS — BP 124/78 | HR 67 | Resp 16 | Ht 65.0 in | Wt 206.7 lb

## 2017-07-14 DIAGNOSIS — N183 Chronic kidney disease, stage 3 unspecified: Secondary | ICD-10-CM

## 2017-07-14 DIAGNOSIS — I10 Essential (primary) hypertension: Secondary | ICD-10-CM

## 2017-07-14 NOTE — Patient Instructions (Signed)
DASH Eating Plan DASH stands for "Dietary Approaches to Stop Hypertension." The DASH eating plan is a healthy eating plan that has been shown to reduce high blood pressure (hypertension). It may also reduce your risk for type 2 diabetes, heart disease, and stroke. The DASH eating plan may also help with weight loss. What are tips for following this plan? General guidelines  Avoid eating more than 2,300 mg (milligrams) of salt (sodium) a day. If you have hypertension, you may need to reduce your sodium intake to 1,500 mg a day.  Limit alcohol intake to no more than 1 drink a day for nonpregnant women and 2 drinks a day for men. One drink equals 12 oz of beer, 5 oz of wine, or 1 oz of hard liquor.  Work with your health care provider to maintain a healthy body weight or to lose weight. Ask what an ideal weight is for you.  Get at least 30 minutes of exercise that causes your heart to beat faster (aerobic exercise) most days of the week. Activities may include walking, swimming, or biking.  Work with your health care provider or diet and nutrition specialist (dietitian) to adjust your eating plan to your individual calorie needs. Reading food labels  Check food labels for the amount of sodium per serving. Choose foods with less than 5 percent of the Daily Value of sodium. Generally, foods with less than 300 mg of sodium per serving fit into this eating plan.  To find whole grains, look for the word "whole" as the first word in the ingredient list. Shopping  Buy products labeled as "low-sodium" or "no salt added."  Buy fresh foods. Avoid canned foods and premade or frozen meals. Cooking  Avoid adding salt when cooking. Use salt-free seasonings or herbs instead of table salt or sea salt. Check with your health care provider or pharmacist before using salt substitutes.  Do not fry foods. Cook foods using healthy methods such as baking, boiling, grilling, and broiling instead.  Cook with  heart-healthy oils, such as olive, canola, soybean, or sunflower oil. Meal planning   Eat a balanced diet that includes: ? 5 or more servings of fruits and vegetables each day. At each meal, try to fill half of your plate with fruits and vegetables. ? Up to 6-8 servings of whole grains each day. ? Less than 6 oz of lean meat, poultry, or fish each day. A 3-oz serving of meat is about the same size as a deck of cards. One egg equals 1 oz. ? 2 servings of low-fat dairy each day. ? A serving of nuts, seeds, or beans 5 times each week. ? Heart-healthy fats. Healthy fats called Omega-3 fatty acids are found in foods such as flaxseeds and coldwater fish, like sardines, salmon, and mackerel.  Limit how much you eat of the following: ? Canned or prepackaged foods. ? Food that is high in trans fat, such as fried foods. ? Food that is high in saturated fat, such as fatty meat. ? Sweets, desserts, sugary drinks, and other foods with added sugar. ? Full-fat dairy products.  Do not salt foods before eating.  Try to eat at least 2 vegetarian meals each week.  Eat more home-cooked food and less restaurant, buffet, and fast food.  When eating at a restaurant, ask that your food be prepared with less salt or no salt, if possible. What foods are recommended? The items listed may not be a complete list. Talk with your dietitian about what   dietary choices are best for you. Grains Whole-grain or whole-wheat bread. Whole-grain or whole-wheat pasta. Brown rice. Oatmeal. Quinoa. Bulgur. Whole-grain and low-sodium cereals. Pita bread. Low-fat, low-sodium crackers. Whole-wheat flour tortillas. Vegetables Fresh or frozen vegetables (raw, steamed, roasted, or grilled). Low-sodium or reduced-sodium tomato and vegetable juice. Low-sodium or reduced-sodium tomato sauce and tomato paste. Low-sodium or reduced-sodium canned vegetables. Fruits All fresh, dried, or frozen fruit. Canned fruit in natural juice (without  added sugar). Meat and other protein foods Skinless chicken or turkey. Ground chicken or turkey. Pork with fat trimmed off. Fish and seafood. Egg whites. Dried beans, peas, or lentils. Unsalted nuts, nut butters, and seeds. Unsalted canned beans. Lean cuts of beef with fat trimmed off. Low-sodium, lean deli meat. Dairy Low-fat (1%) or fat-free (skim) milk. Fat-free, low-fat, or reduced-fat cheeses. Nonfat, low-sodium ricotta or cottage cheese. Low-fat or nonfat yogurt. Low-fat, low-sodium cheese. Fats and oils Soft margarine without trans fats. Vegetable oil. Low-fat, reduced-fat, or light mayonnaise and salad dressings (reduced-sodium). Canola, safflower, olive, soybean, and sunflower oils. Avocado. Seasoning and other foods Herbs. Spices. Seasoning mixes without salt. Unsalted popcorn and pretzels. Fat-free sweets. What foods are not recommended? The items listed may not be a complete list. Talk with your dietitian about what dietary choices are best for you. Grains Baked goods made with fat, such as croissants, muffins, or some breads. Dry pasta or rice meal packs. Vegetables Creamed or fried vegetables. Vegetables in a cheese sauce. Regular canned vegetables (not low-sodium or reduced-sodium). Regular canned tomato sauce and paste (not low-sodium or reduced-sodium). Regular tomato and vegetable juice (not low-sodium or reduced-sodium). Pickles. Olives. Fruits Canned fruit in a light or heavy syrup. Fried fruit. Fruit in cream or butter sauce. Meat and other protein foods Fatty cuts of meat. Ribs. Fried meat. Bacon. Sausage. Bologna and other processed lunch meats. Salami. Fatback. Hotdogs. Bratwurst. Salted nuts and seeds. Canned beans with added salt. Canned or smoked fish. Whole eggs or egg yolks. Chicken or turkey with skin. Dairy Whole or 2% milk, cream, and half-and-half. Whole or full-fat cream cheese. Whole-fat or sweetened yogurt. Full-fat cheese. Nondairy creamers. Whipped toppings.  Processed cheese and cheese spreads. Fats and oils Butter. Stick margarine. Lard. Shortening. Ghee. Bacon fat. Tropical oils, such as coconut, palm kernel, or palm oil. Seasoning and other foods Salted popcorn and pretzels. Onion salt, garlic salt, seasoned salt, table salt, and sea salt. Worcestershire sauce. Tartar sauce. Barbecue sauce. Teriyaki sauce. Soy sauce, including reduced-sodium. Steak sauce. Canned and packaged gravies. Fish sauce. Oyster sauce. Cocktail sauce. Horseradish that you find on the shelf. Ketchup. Mustard. Meat flavorings and tenderizers. Bouillon cubes. Hot sauce and Tabasco sauce. Premade or packaged marinades. Premade or packaged taco seasonings. Relishes. Regular salad dressings. Where to find more information:  National Heart, Lung, and Blood Institute: www.nhlbi.nih.gov  American Heart Association: www.heart.org Summary  The DASH eating plan is a healthy eating plan that has been shown to reduce high blood pressure (hypertension). It may also reduce your risk for type 2 diabetes, heart disease, and stroke.  With the DASH eating plan, you should limit salt (sodium) intake to 2,300 mg a day. If you have hypertension, you may need to reduce your sodium intake to 1,500 mg a day.  When on the DASH eating plan, aim to eat more fresh fruits and vegetables, whole grains, lean proteins, low-fat dairy, and heart-healthy fats.  Work with your health care provider or diet and nutrition specialist (dietitian) to adjust your eating plan to your individual   calorie needs. This information is not intended to replace advice given to you by your health care provider. Make sure you discuss any questions you have with your health care provider. Document Released: 07/16/2011 Document Revised: 07/20/2016 Document Reviewed: 07/20/2016 Elsevier Interactive Patient Education  2017 Elsevier Inc.  

## 2017-07-14 NOTE — Progress Notes (Signed)
Date:  07/14/2017   Name:  Sherry PoliceChristine Sype Shoberg   DOB:  [birth year]   MRN:  [id number]   Chief Complaint: No chief complaint on file. Hypertension  This is a chronic problem. The problem is controlled. Pertinent negatives include no chest pain, headaches, palpitations or shortness of breath. Past treatments include ACE inhibitors and diuretics. The current treatment provides significant improvement. Hypertensive end-organ damage includes kidney disease.   Lab Results  Component Value Date   CREATININE 1.08 (H) 01/12/2017   BUN 24 01/12/2017   NA 141 01/12/2017   K 4.6 01/12/2017   CL 100 01/12/2017   CO2 26 01/12/2017      Review of Systems  Constitutional: Negative for appetite change, fatigue, fever and unexpected weight change.  HENT: Negative for tinnitus and trouble swallowing.   Eyes: Negative for visual disturbance.  Respiratory: Negative for cough, chest tightness and shortness of breath.   Cardiovascular: Negative for chest pain, palpitations and leg swelling.  Gastrointestinal: Negative for abdominal pain.  Genitourinary: Negative for dysuria and hematuria.  Musculoskeletal: Positive for arthralgias.  Neurological: Negative for tremors, numbness and headaches.  Psychiatric/Behavioral: Negative for dysphoric mood.    Patient Active Problem List   Diagnosis Date Noted  . Primary osteoarthritis of right hip 03/26/2017  . CKD (chronic kidney disease), stage III (HCC) 12/20/2015  . Chronic pain 06/04/2015  . Essential (primary) hypertension 06/04/2015  . Mild hyperlipidemia 06/04/2015  . Muscle spasms of head and/or neck 06/04/2015  . Neuropathy 06/04/2015  . Generalized OA 06/04/2015  . Tendinitis of wrist 06/04/2015    Prior to Admission medications   Medication Sig Start Date End Date Taking? Authorizing Provider  Alpha-Lipoic Acid 300 MG TABS Take by mouth.   Yes [provider]  calcium-vitamin D (OSCAL WITH D) 500-200 MG-UNIT TABS tablet  Take by mouth.   Yes [provider]  Coenzyme Q10 (COQ10) 200 MG CAPS Take by mouth.   Yes [provider]  lisinopril-hydrochlorothiazide (PRINZIDE,ZESTORETIC) 20-25 MG tablet Take 1 tablet by mouth daily. 01/12/17  Yes Reubin MilanBerglund, Kanaya Gunnarson H, MD  Magnesium 200 MG TABS Take by mouth.   Yes [provider]  naproxen sodium (ALEVE) 220 MG tablet Take 1 tablet by mouth daily.    Yes [provider]  Omega-3 Fatty Acids (FISH OIL BURP-LESS) 1000 MG CAPS Take 1 capsule by mouth daily.   Yes [provider]    Allergies  Allergen Reactions  . Celecoxib Swelling  . Pregabalin     Other reaction(s): Dizziness    Past Surgical History:  Procedure Laterality Date  . Breast biopsy Left   . CHOLECYSTECTOMY    . LAMINECTOMY    . TOTAL VAGINAL HYSTERECTOMY      Social History   Tobacco Use  . Smoking status: Never Smoker  . Smokeless tobacco: Never Used  Substance Use Topics  . Alcohol use: No    Alcohol/week: 0.0 oz  . Drug use: Not on file     Medication list has been reviewed and updated.  PHQ 2/9 Scores 01/12/2017 12/19/2015 06/10/2015  PHQ - 2 Score 3 2 1   PHQ- 9 Score 4 10 -    Physical Exam  Constitutional: She is oriented to person, place, and time. She appears well-developed. No distress.  HENT:  Head: Normocephalic and atraumatic.  Neck: Normal range of motion. Neck supple.  Cardiovascular: Normal rate, regular rhythm and normal heart sounds.  Pulmonary/Chest: Effort normal and breath sounds normal. No respiratory  distress. She has no wheezes.  Neurological: She is alert and oriented to person, place, and time.  Skin: Skin is warm and dry. No rash noted.  Psychiatric: She has a normal mood and affect. Her behavior is normal. Thought content normal.  Nursing note and vitals reviewed.   BP 138/88   Pulse 67   Resp 16   Ht 5\' 5"  (1.651 m)   Wt 206 lb 11.2 oz (93.8 kg)   SpO2 97%   BMI 34.40 kg/m   Assessment and Plan: 1.  Essential (primary) hypertension controlled - Basic metabolic panel  2. CKD (chronic kidney disease), stage III (HCC) Continue to monitor - Basic metabolic panel   No orders of the defined types were placed in this encounter.   Partially dictated using Animal nutritionistDragon software. Any errors are unintentional.  Bari EdwardLaura Alara Daniel, MD [facility]  07/14/2017

## 2017-07-15 LAB — BASIC METABOLIC PANEL
BUN/Creatinine Ratio: 31 — ABNORMAL HIGH (ref 12–28)
BUN: 33 mg/dL — ABNORMAL HIGH (ref 8–27)
CHLORIDE: 101 mmol/L (ref 96–106)
CO2: 25 mmol/L (ref 20–29)
Calcium: 9.1 mg/dL (ref 8.7–10.3)
Creatinine, Ser: 1.07 mg/dL — ABNORMAL HIGH (ref 0.57–1.00)
GFR calc Af Amer: 55 mL/min/{1.73_m2} — ABNORMAL LOW (ref >59)
GFR calc non Af Amer: 48 mL/min/{1.73_m2} — ABNORMAL LOW (ref >59)
GLUCOSE: 80 mg/dL (ref 65–99)
POTASSIUM: 4.6 mmol/L (ref 3.5–5.2)
SODIUM: 140 mmol/L (ref 134–144)

## 2017-10-05 ENCOUNTER — Encounter: Payer: Self-pay | Admitting: Internal Medicine

## 2017-10-05 ENCOUNTER — Ambulatory Visit: Payer: Medicare Other | Admitting: Internal Medicine

## 2017-10-05 VITALS — BP 136/80 | HR 68 | Temp 97.8°F | Ht 65.0 in | Wt 212.0 lb

## 2017-10-05 DIAGNOSIS — J069 Acute upper respiratory infection, unspecified: Secondary | ICD-10-CM | POA: Diagnosis not present

## 2017-10-05 NOTE — Patient Instructions (Signed)
Delsym - over the counter cough syrup 

## 2017-10-05 NOTE — Progress Notes (Signed)
Date:  10/05/2017   Name:  Sherry Morales   DOB:  1930-10-04   MRN:  [id number]   Chief Complaint: Cough (Cough- no production. Cough feels deep. Sometimes cough's and cannot stop. No fever, body aches. Dull headache in beginning but not present now. been taking mucinex D and airborne to try to help with this.  )  Cough  This is a new problem. The current episode started 1 to 4 weeks ago. The problem has been gradually improving. The problem occurs every few hours. The cough is non-productive. Pertinent negatives include no chest pain, chills, ear pain, fever, headaches, nasal congestion, postnasal drip, sore throat, shortness of breath, weight loss or wheezing. The symptoms are aggravated by lying down. She has tried OTC cough suppressant for the symptoms. The treatment provided moderate relief.  She had a cold last week and feels much better.  Her cough is improved.  She is eating and drinking well. Sleeping without much interruption.  No ear pain, HA or dizziness.   Review of Systems  Constitutional: Negative for chills, fatigue, fever and weight loss.  HENT: Negative for dental problem, ear pain, postnasal drip, sinus pressure and sore throat.   Respiratory: Positive for cough. Negative for apnea, chest tightness, shortness of breath and wheezing.   Cardiovascular: Negative for chest pain and palpitations.  Neurological: Negative for dizziness and headaches.  Psychiatric/Behavioral: Negative for sleep disturbance.    Patient Active Problem List   Diagnosis Date Noted  . Primary osteoarthritis of right hip 03/26/2017  . CKD (chronic kidney disease), stage III (HCC) 12/20/2015  . Essential (primary) hypertension 06/04/2015  . Mild hyperlipidemia 06/04/2015  . Muscle spasms of head and/or neck 06/04/2015  . Neuropathy 06/04/2015  . Generalized OA 06/04/2015  . Tendinitis of wrist 06/04/2015    Prior to Admission medications   Medication Sig Start Date End Date Taking?  Authorizing Provider  Alpha-Lipoic Acid 300 MG TABS Take by mouth.   Yes [provider]  calcium-vitamin D (OSCAL WITH D) 500-200 MG-UNIT TABS tablet Take by mouth.   Yes [provider]  Coenzyme Q10 (COQ10) 200 MG CAPS Take by mouth.   Yes [provider]  lisinopril-hydrochlorothiazide (PRINZIDE,ZESTORETIC) 20-25 MG tablet Take 1 tablet by mouth daily. 01/12/17  Yes Reubin MilanBerglund, Akia Desroches H, MD  Magnesium 200 MG TABS Take by mouth.   Yes [provider]  naproxen sodium (ALEVE) 220 MG tablet Take 1 tablet by mouth daily.    Yes [provider]  Omega-3 Fatty Acids (FISH OIL BURP-LESS) 1000 MG CAPS Take 1 capsule by mouth daily.   Yes [provider]    Allergies  Allergen Reactions  . Celecoxib Swelling  . Pregabalin     Other reaction(s): Dizziness    Past Surgical History:  Procedure Laterality Date  . Breast biopsy Left   . CHOLECYSTECTOMY    . LAMINECTOMY    . TOTAL VAGINAL HYSTERECTOMY      Social History   Tobacco Use  . Smoking status: Never Smoker  . Smokeless tobacco: Never Used  Substance Use Topics  . Alcohol use: No    Alcohol/week: 0.0 oz  . Drug use: No     Medication list has been reviewed and updated.  PHQ 2/9 Scores 01/12/2017 12/19/2015 06/10/2015  PHQ - 2 Score 3 2 1   PHQ- 9 Score 4 10 -    Physical Exam  Constitutional: She is oriented to person, place, and time. She appears well-developed. No  distress.  HENT:  Head: Normocephalic and atraumatic.  Right Ear: Tympanic membrane and ear canal normal.  Left Ear: Tympanic membrane and ear canal normal.  Nose: Right sinus exhibits no maxillary sinus tenderness and no frontal sinus tenderness. Left sinus exhibits no maxillary sinus tenderness and no frontal sinus tenderness.  Mouth/Throat: No posterior oropharyngeal edema or posterior oropharyngeal erythema.  Neck: Normal range of motion. Neck supple.  Cardiovascular: Normal rate, regular rhythm and normal  heart sounds.  Pulmonary/Chest: Effort normal and breath sounds normal. No respiratory distress. She has no decreased breath sounds. She has no wheezes.  Musculoskeletal: Normal range of motion.  Neurological: She is alert and oriented to person, place, and time.  Skin: Skin is warm and dry. No rash noted.  Psychiatric: She has a normal mood and affect. Her behavior is normal. Thought content normal.  Nursing note and vitals reviewed.   BP 136/80   Pulse 68   Temp 97.8 F (36.6 C) (Oral)   Ht 5\' 5"  (1.651 m)   Wt 212 lb (96.2 kg)   SpO2 97%   BMI 35.28 kg/m   Assessment and Plan: 1. Viral URI No indication for antibiotics Continue otc cough syrup if needed   No orders of the defined types were placed in this encounter.   Partially dictated using Animal nutritionist. Any errors are unintentional.  Bari Edward, MD [facility]  10/05/2017

## 2017-11-24 ENCOUNTER — Telehealth: Payer: Self-pay | Admitting: Internal Medicine

## 2017-11-24 NOTE — Telephone Encounter (Signed)
Patient called complains of skin flush blood pressure 185/91 she stated that she left you a voicemail today wanting to know if she needed to be seen.

## 2017-11-24 NOTE — Telephone Encounter (Signed)
Spoke with patient and informed we need to see her for appt. Transferred call to front desk to make appt. Thank you.

## 2017-11-25 ENCOUNTER — Ambulatory Visit: Payer: Medicare Other | Admitting: Internal Medicine

## 2017-11-25 ENCOUNTER — Encounter: Payer: Self-pay | Admitting: Internal Medicine

## 2017-11-25 VITALS — BP 136/84 | HR 98 | Ht 65.0 in | Wt 204.8 lb

## 2017-11-25 DIAGNOSIS — I10 Essential (primary) hypertension: Secondary | ICD-10-CM

## 2017-11-25 DIAGNOSIS — E785 Hyperlipidemia, unspecified: Secondary | ICD-10-CM

## 2017-11-25 DIAGNOSIS — M1611 Unilateral primary osteoarthritis, right hip: Secondary | ICD-10-CM | POA: Diagnosis not present

## 2017-11-25 DIAGNOSIS — R350 Frequency of micturition: Secondary | ICD-10-CM

## 2017-11-25 LAB — POCT URINALYSIS DIPSTICK
BILIRUBIN UA: NEGATIVE
GLUCOSE UA: NEGATIVE
Ketones, UA: 40
Nitrite, UA: NEGATIVE
PH UA: 5 (ref 5.0–8.0)
Protein, UA: NEGATIVE
UROBILINOGEN UA: 0.2 U/dL

## 2017-11-25 MED ORDER — CEPHALEXIN 250 MG PO CAPS: 250.0000 mg | ORAL_CAPSULE | Freq: Four times a day (QID) | ORAL | 0 refills | Status: AC

## 2017-11-25 NOTE — Progress Notes (Signed)
Date:  11/25/2017   Name:  Sherry PoliceChristine Sype Pruden   DOB:  [birth year]   MRN:  [id number]   Chief Complaint: Hypertension Hypertension  This is a chronic problem. The problem has been waxing and waning (has been high in the recent past at several outside offices) since onset. Associated symptoms include headaches. Pertinent negatives include no chest pain, palpitations or shortness of breath. Past treatments include ACE inhibitors and diuretics. The current treatment provides significant improvement.  Hip Pain   There was no injury mechanism. The pain is present in the right hip. The pain is severe. The pain has been worsening since onset. Pertinent negatives include no numbness. Treatments tried: THA planned for mid May.   BP Readings from Last 3 Encounters:  11/25/17 136/84  10/05/17 136/80  07/14/17 124/78      Review of Systems  Constitutional: Negative for chills, fatigue and fever.  HENT: Negative for trouble swallowing and voice change.   Respiratory: Negative for cough, chest tightness and shortness of breath.   Cardiovascular: Negative for chest pain, palpitations and leg swelling.  Gastrointestinal: Negative for abdominal pain.  Genitourinary: Positive for frequency and urgency. Negative for dysuria and hematuria.  Musculoskeletal: Positive for arthralgias and gait problem.  Neurological: Positive for headaches. Negative for dizziness, tremors, speech difficulty, weakness, light-headedness and numbness.  Psychiatric/Behavioral: Negative for sleep disturbance.    Patient Active Problem List   Diagnosis Date Noted  . Primary osteoarthritis of right hip 03/26/2017  . CKD (chronic kidney disease), stage III (HCC) 12/20/2015  . Essential (primary) hypertension 06/04/2015  . Mild hyperlipidemia 06/04/2015  . Muscle spasms of head and/or neck 06/04/2015  . Neuropathy 06/04/2015  . Generalized OA 06/04/2015  . Tendinitis of wrist 06/04/2015    Prior to Admission  medications   Medication Sig Start Date End Date Taking? Authorizing Provider  Alpha-Lipoic Acid 300 MG TABS Take by mouth.    [provider]  calcium-vitamin D (OSCAL WITH D) 500-200 MG-UNIT TABS tablet Take by mouth.    [provider]  Coenzyme Q10 (COQ10) 200 MG CAPS Take by mouth.    [provider]  lisinopril-hydrochlorothiazide (PRINZIDE,ZESTORETIC) 20-25 MG tablet Take 1 tablet by mouth daily. 01/12/17   Reubin MilanBerglund, Rochelle Nephew H, MD  Magnesium 200 MG TABS Take by mouth.    [provider]  naproxen sodium (ALEVE) 220 MG tablet Take 1 tablet by mouth daily.     [provider]  Omega-3 Fatty Acids (FISH OIL BURP-LESS) 1000 MG CAPS Take 1 capsule by mouth daily.    [provider]    Allergies  Allergen Reactions  . Celecoxib Swelling  . Pregabalin     Other reaction(s): Dizziness    Past Surgical History:  Procedure Laterality Date  . Breast biopsy Left   . CHOLECYSTECTOMY    . LAMINECTOMY    . TOTAL VAGINAL HYSTERECTOMY      Social History   Tobacco Use  . Smoking status: Never Smoker  . Smokeless tobacco: Never Used  Substance Use Topics  . Alcohol use: No    Alcohol/week: 0.0 oz  . Drug use: No     Medication list has been reviewed and updated.  PHQ 2/9 Scores 01/12/2017 12/19/2015 06/10/2015  PHQ - 2 Score 3 2 1   PHQ- 9 Score 4 10 -    Physical Exam  Constitutional: She is oriented to person, place, and time. She appears well-developed. No distress.  HENT:  Head: Normocephalic and atraumatic.  Neck: Normal range of motion. Neck supple. Carotid bruit is present (faint right bruit). No thyroid mass and no thyromegaly present.  Cardiovascular: Normal rate, regular rhythm and normal heart sounds.  Pulmonary/Chest: Effort normal and breath sounds normal. No respiratory distress.  Abdominal: Soft. Normal appearance. There is no tenderness.  Musculoskeletal: Normal range of motion.  Neurological: She is alert and  oriented to person, place, and time. She has normal strength and normal reflexes. No cranial nerve deficit or sensory deficit.  Normal finger to nose bilaterally No pronator drift  Skin: Skin is warm and dry. No rash noted.  Psychiatric: She has a normal mood and affect. Her behavior is normal. Thought content normal.    BP 136/84   Pulse 98   Ht 5\' 5"  (1.651 m)   Wt 204 lb 12.8 oz (92.9 kg)   SpO2 95%   BMI 34.08 kg/m   Assessment and Plan: 1. Essential (primary) hypertension Planning major surgery Appears controlled today - EKG 12-Lead - SR @ 85 - CBC with Differential/Platelet - Comprehensive metabolic panel - TSH  2. Primary osteoarthritis of right hip Planning THR  3. Urinary frequency May be the cause of vague sx  - POCT urinalysis dipstick - cephALEXin (KEFLEX) 250 MG capsule; Take 1 capsule (250 mg total) by mouth 4 (four) times daily for 5 days.  Dispense: 20 capsule; Refill: 0  4. Mild hyperlipidemia Check [place] before surgery - US Carotid Duplex Bilateral; Future   Meds ordered this encounter  Medications  . cephALEXin (KEFLEX) 250 MG capsule    Sig: Take 1 capsule (250 mg total) by mouth 4 (four) times daily for 5 days.    Dispense:  20 capsule    Refill:  0    Partially dictated using Animal nutritionist. Any errors are unintentional.  Bari Edward, MD [facility]  11/25/2017

## 2017-11-26 LAB — CBC WITH DIFFERENTIAL/PLATELET
BASOS: 0 %
Basophils Absolute: 0 10*3/uL (ref 0.0–0.2)
EOS (ABSOLUTE): 0 10*3/uL (ref 0.0–0.4)
Eos: 1 %
Hematocrit: 33.9 % — ABNORMAL LOW (ref 34.0–46.6)
Hemoglobin: 12.2 g/dL (ref 11.1–15.9)
IMMATURE GRANS (ABS): 0 10*3/uL (ref 0.0–0.1)
Immature Granulocytes: 0 %
LYMPHS: 10 %
Lymphocytes Absolute: 0.9 10*3/uL (ref 0.7–3.1)
MCH: 28.6 pg (ref 26.6–33.0)
MCHC: 36 g/dL — ABNORMAL HIGH (ref 31.5–35.7)
MCV: 80 fL (ref 79–97)
MONOS ABS: 0.3 10*3/uL (ref 0.1–0.9)
Monocytes: 4 %
NEUTROS ABS: 7.6 10*3/uL — AB (ref 1.4–7.0)
Neutrophils: 85 %
PLATELETS: 264 10*3/uL (ref 150–379)
RBC: 4.26 x10E6/uL (ref 3.77–5.28)
RDW: 14 % (ref 12.3–15.4)
WBC: 8.9 10*3/uL (ref 3.4–10.8)

## 2017-11-26 LAB — COMPREHENSIVE METABOLIC PANEL
A/G RATIO: 2.2 (ref 1.2–2.2)
ALBUMIN: 4.8 g/dL — AB (ref 3.5–4.7)
ALT: 17 IU/L (ref 0–32)
AST: 29 IU/L (ref 0–40)
Alkaline Phosphatase: 115 IU/L (ref 39–117)
BILIRUBIN TOTAL: 0.3 mg/dL (ref 0.0–1.2)
BUN / CREAT RATIO: 26 (ref 12–28)
BUN: 31 mg/dL — ABNORMAL HIGH (ref 8–27)
CHLORIDE: 99 mmol/L (ref 96–106)
CO2: 23 mmol/L (ref 20–29)
Calcium: 9.8 mg/dL (ref 8.7–10.3)
Creatinine, Ser: 1.19 mg/dL — ABNORMAL HIGH (ref 0.57–1.00)
GFR calc Af Amer: 48 mL/min/{1.73_m2} — ABNORMAL LOW (ref >59)
GFR calc non Af Amer: 42 mL/min/{1.73_m2} — ABNORMAL LOW (ref >59)
Globulin, Total: 2.2 g/dL (ref 1.5–4.5)
Glucose: 103 mg/dL — ABNORMAL HIGH (ref 65–99)
POTASSIUM: 4.3 mmol/L (ref 3.5–5.2)
Sodium: 140 mmol/L (ref 134–144)
TOTAL PROTEIN: 7 g/dL (ref 6.0–8.5)

## 2017-11-26 LAB — TSH: TSH: 3.94 u[IU]/mL (ref 0.450–4.500)

## 2017-12-02 ENCOUNTER — Ambulatory Visit
Admission: RE | Admit: 2017-12-02 | Discharge: 2017-12-02 | Disposition: A | Discharge status: Home / Self Care | Payer: Medicare Other | Source: Ambulatory Visit | Attending: Internal Medicine | Admitting: Internal Medicine

## 2017-12-02 DIAGNOSIS — E785 Hyperlipidemia, unspecified: Secondary | ICD-10-CM | POA: Diagnosis present

## 2017-12-02 DIAGNOSIS — I6523 Occlusion and stenosis of bilateral carotid arteries: Secondary | ICD-10-CM | POA: Insufficient documentation

## 2017-12-08 ENCOUNTER — Encounter
Admission: RE | Admit: 2017-12-08 | Discharge: 2017-12-08 | Disposition: A | Discharge status: Home / Self Care | Payer: Medicare Other | Source: Ambulatory Visit | Attending: Orthopedic Surgery | Admitting: Orthopedic Surgery

## 2017-12-08 ENCOUNTER — Encounter: Payer: Self-pay | Admitting: *Deleted

## 2017-12-08 ENCOUNTER — Other Ambulatory Visit: Payer: Self-pay

## 2017-12-08 DIAGNOSIS — N183 Chronic kidney disease, stage 3 (moderate): Secondary | ICD-10-CM | POA: Insufficient documentation

## 2017-12-08 DIAGNOSIS — M25551 Pain in right hip: Secondary | ICD-10-CM | POA: Insufficient documentation

## 2017-12-08 DIAGNOSIS — E785 Hyperlipidemia, unspecified: Secondary | ICD-10-CM | POA: Diagnosis not present

## 2017-12-08 DIAGNOSIS — Z01812 Encounter for preprocedural laboratory examination: Secondary | ICD-10-CM | POA: Diagnosis not present

## 2017-12-08 DIAGNOSIS — M1611 Unilateral primary osteoarthritis, right hip: Secondary | ICD-10-CM | POA: Insufficient documentation

## 2017-12-08 DIAGNOSIS — I129 Hypertensive chronic kidney disease with stage 1 through stage 4 chronic kidney disease, or unspecified chronic kidney disease: Secondary | ICD-10-CM | POA: Insufficient documentation

## 2017-12-08 DIAGNOSIS — Z79899 Other long term (current) drug therapy: Secondary | ICD-10-CM | POA: Diagnosis not present

## 2017-12-08 HISTORY — DX: Anxiety disorder, unspecified: F41.9

## 2017-12-08 HISTORY — DX: Hyperlipidemia, unspecified: E78.5

## 2017-12-08 HISTORY — DX: Unspecified osteoarthritis, unspecified site: M19.90

## 2017-12-08 HISTORY — DX: Essential (primary) hypertension: I10

## 2017-12-08 HISTORY — DX: Polyneuropathy, unspecified: G62.9

## 2017-12-08 HISTORY — DX: Chronic kidney disease, unspecified: N18.9

## 2017-12-08 LAB — SURGICAL PCR SCREEN
MRSA, PCR: NEGATIVE
Staphylococcus aureus: NEGATIVE

## 2017-12-08 LAB — URINALYSIS, ROUTINE W REFLEX MICROSCOPIC
Bacteria, UA: NONE SEEN
Bilirubin Urine: NEGATIVE
GLUCOSE, UA: NEGATIVE mg/dL
HGB URINE DIPSTICK: NEGATIVE
Ketones, ur: NEGATIVE mg/dL
NITRITE: NEGATIVE
PROTEIN: NEGATIVE mg/dL
Specific Gravity, Urine: 1.023 (ref 1.005–1.030)
pH: 5 (ref 5.0–8.0)

## 2017-12-08 LAB — TYPE AND SCREEN
ABO/RH(D): O POS
ANTIBODY SCREEN: NEGATIVE

## 2017-12-08 LAB — COMPREHENSIVE METABOLIC PANEL
ALT: 16 U/L (ref 14–54)
AST: 25 U/L (ref 15–41)
Albumin: 4.5 g/dL (ref 3.5–5.0)
Alkaline Phosphatase: 101 U/L (ref 38–126)
Anion gap: 9 (ref 5–15)
BILIRUBIN TOTAL: 0.4 mg/dL (ref 0.3–1.2)
BUN: 31 mg/dL — AB (ref 6–20)
CHLORIDE: 103 mmol/L (ref 101–111)
CO2: 27 mmol/L (ref 22–32)
Calcium: 9.2 mg/dL (ref 8.9–10.3)
Creatinine, Ser: 1.11 mg/dL — ABNORMAL HIGH (ref 0.44–1.00)
GFR calc non Af Amer: 44 mL/min — ABNORMAL LOW (ref >60)
GFR, EST AFRICAN AMERICAN: 51 mL/min — AB (ref >60)
Glucose, Bld: 110 mg/dL — ABNORMAL HIGH (ref 65–99)
POTASSIUM: 3.5 mmol/L (ref 3.5–5.1)
Sodium: 139 mmol/L (ref 135–145)
TOTAL PROTEIN: 7.5 g/dL (ref 6.5–8.1)

## 2017-12-08 LAB — CBC
HEMATOCRIT: 35.6 % (ref 35.0–47.0)
Hemoglobin: 12.4 g/dL (ref 12.0–16.0)
MCH: 30.4 pg (ref 26.0–34.0)
MCHC: 34.7 g/dL (ref 32.0–36.0)
MCV: 87.4 fL (ref 80.0–100.0)
PLATELETS: 296 10*3/uL (ref 150–440)
RBC: 4.07 MIL/uL (ref 3.80–5.20)
RDW: 14 % (ref 11.5–14.5)
WBC: 9.6 10*3/uL (ref 3.6–11.0)

## 2017-12-08 LAB — SEDIMENTATION RATE: Sed Rate: 18 mm/hr (ref 0–30)

## 2017-12-08 LAB — C-REACTIVE PROTEIN: CRP: <0.8 mg/dL (ref <1.0)

## 2017-12-08 LAB — APTT: aPTT: 27 seconds (ref 24–36)

## 2017-12-08 LAB — PROTIME-INR
INR: 0.99
Prothrombin Time: 13 seconds (ref 11.4–15.2)

## 2017-12-08 NOTE — Patient Instructions (Signed)
Your procedure is scheduled on: Dec 20, 2017 MONDAY Report to Day Surgery on the 2nd floor of the [facility]. To find out your arrival time, please call [phone] between 1PM - 3PM on: Friday Dec 17, 2017   REMEMBER: Instructions that are not followed completely may result in serious medical risk, up to and including death; or upon the discretion of your surgeon and anesthesiologist your surgery may need to be rescheduled.  Do not eat food after midnight the night before your procedure.  No gum chewing, lozengers or hard candies.  You may however, drink CLEAR liquids up to 2 hours before you are scheduled to arrive for your surgery. Do not drink anything within 2 hours of the start of your surgery.  Clear liquids include: - water  - apple juice without pulp - clear gatorade - black coffee or tea (Do NOT add anything to the coffee or tea) Do NOT drink anything that is not on this list.  Type 1 and Type 2 diabetics should only drink water.  No Alcohol for 24 hours before or after surgery.  No Smoking including e-cigarettes for 24 hours prior to surgery.  No chewable tobacco products for at least 6 hours prior to surgery.  No nicotine patches on the day of surgery.  On the morning of surgery brush your teeth with toothpaste and water, you may rinse your mouth with mouthwash if you wish. Do not swallow any toothpaste or mouthwash.  Notify your doctor if there is any change in your medical condition (cold, fever, infection).  Do not wear jewelry, make-up, hairpins, clips or nail polish.  Do not wear lotions, powders, or perfumes. You may NOT wear deodorant.  Do not shave 48 hours prior to surgery. Men may shave face and neck.  Contacts and dentures may not be worn into surgery.  Do not bring valuables to the hospital, including drivers license, insurance or credit cards.  [facility] is not responsible for any belongings or valuables.   TAKE THESE MEDICATIONS THE MORNING  OF SURGERY:  Use CHG Soap  as directed on instruction sheet.  Stop Anti-inflammatories (NSAIDS) such as Advil, Aleve, Ibuprofen, Motrin, Naproxen, Naprosyn and Aspirin based products such as Excedrin, Goodys Powder, BC Powder. (May take Tylenol or Acetaminophen if needed.) LAST DOSE  MAY 6  Stop ANY OVER THE COUNTER supplements until after surgery.LAST DOSE MAY 6 (May continue Vitamin D, Vitamin B, POTASSIUM and multivitamin.)  Wear comfortable clothing (specific to your surgery type) to the hospital.  Plan for stool softeners for home use.  If you are being admitted to the hospital overnight, leave your suitcase in the car. After surgery it may be brought to your room.  If you are being discharged the day of surgery, you will not be allowed to drive home. You will need a responsible adult to drive you home and stay with you that night.   Please call [phone] if you have any questions about these instructions.

## 2017-12-09 LAB — URINE CULTURE: Special Requests: NORMAL

## 2017-12-19 MED ORDER — TRANEXAMIC ACID 1000 MG/10ML IV SOLN
1000.0000 mg | INTRAVENOUS | Status: DC
  Filled 2017-12-19: qty 10

## 2017-12-19 MED ORDER — CEFAZOLIN SODIUM-DEXTROSE 2-4 GM/100ML-% IV SOLN
2.0000 g | INTRAVENOUS | Status: DC
Start: 2017-12-20 — End: 2017-12-20

## 2017-12-20 ENCOUNTER — Inpatient Hospital Stay: Payer: Medicare Other

## 2017-12-20 ENCOUNTER — Inpatient Hospital Stay: Payer: Medicare Other | Admitting: Anesthesiology

## 2017-12-20 ENCOUNTER — Encounter
Admission: RE | Disposition: A | Discharge status: Skilled Nursing Facility | Payer: Self-pay | Source: Ambulatory Visit | Attending: Orthopedic Surgery

## 2017-12-20 ENCOUNTER — Inpatient Hospital Stay
Admission: RE | Admit: 2017-12-20 | Discharge: 2017-12-22 | DRG: 470 | Disposition: A | Discharge status: Skilled Nursing Facility | Payer: Medicare Other | Source: Ambulatory Visit | Attending: Orthopedic Surgery | Admitting: Orthopedic Surgery

## 2017-12-20 ENCOUNTER — Other Ambulatory Visit: Payer: Self-pay

## 2017-12-20 ENCOUNTER — Encounter: Payer: Self-pay | Admitting: Orthopedic Surgery

## 2017-12-20 DIAGNOSIS — M1611 Unilateral primary osteoarthritis, right hip: Secondary | ICD-10-CM | POA: Diagnosis present

## 2017-12-20 DIAGNOSIS — G629 Polyneuropathy, unspecified: Secondary | ICD-10-CM | POA: Diagnosis present

## 2017-12-20 DIAGNOSIS — Z96649 Presence of unspecified artificial hip joint: Secondary | ICD-10-CM

## 2017-12-20 DIAGNOSIS — N183 Chronic kidney disease, stage 3 (moderate): Secondary | ICD-10-CM | POA: Diagnosis present

## 2017-12-20 DIAGNOSIS — Z8619 Personal history of other infectious and parasitic diseases: Secondary | ICD-10-CM

## 2017-12-20 DIAGNOSIS — I129 Hypertensive chronic kidney disease with stage 1 through stage 4 chronic kidney disease, or unspecified chronic kidney disease: Secondary | ICD-10-CM | POA: Diagnosis present

## 2017-12-20 DIAGNOSIS — Z886 Allergy status to analgesic agent status: Secondary | ICD-10-CM

## 2017-12-20 DIAGNOSIS — M810 Age-related osteoporosis without current pathological fracture: Secondary | ICD-10-CM | POA: Diagnosis present

## 2017-12-20 DIAGNOSIS — Z888 Allergy status to other drugs, medicaments and biological substances status: Secondary | ICD-10-CM

## 2017-12-20 DIAGNOSIS — Z90710 Acquired absence of both cervix and uterus: Secondary | ICD-10-CM

## 2017-12-20 DIAGNOSIS — E785 Hyperlipidemia, unspecified: Secondary | ICD-10-CM | POA: Diagnosis present

## 2017-12-20 HISTORY — PX: TOTAL HIP ARTHROPLASTY: SHX124

## 2017-12-20 LAB — ABO/RH: ABO/RH(D): O POS

## 2017-12-20 SURGERY — ARTHROPLASTY, HIP, TOTAL,POSTERIOR APPROACH
Anesthesia: Spinal | Laterality: Right

## 2017-12-20 MED ORDER — PROPOFOL 10 MG/ML IV BOLUS
INTRAVENOUS | Status: DC | PRN
  Administered 2017-12-20: 20 mg via INTRAVENOUS

## 2017-12-20 MED ORDER — CEFAZOLIN SODIUM-DEXTROSE 2-4 GM/100ML-% IV SOLN
INTRAVENOUS | Status: AC
  Filled 2017-12-20: qty 100

## 2017-12-20 MED ORDER — GABAPENTIN 300 MG PO CAPS
300.0000 mg | ORAL_CAPSULE | Freq: Once | ORAL | Status: AC
  Administered 2017-12-20: 300 mg via ORAL

## 2017-12-20 MED ORDER — FLEET ENEMA 7-19 GM/118ML RE ENEM: 1.0000 | ENEMA | Freq: Once | RECTAL | Status: DC | PRN

## 2017-12-20 MED ORDER — DEXAMETHASONE SODIUM PHOSPHATE 10 MG/ML IJ SOLN
8.0000 mg | Freq: Once | INTRAMUSCULAR | Status: AC
  Administered 2017-12-20: 8 mg via INTRAVENOUS

## 2017-12-20 MED ORDER — ACETAMINOPHEN 10 MG/ML IV SOLN
INTRAVENOUS | Status: DC | PRN
  Administered 2017-12-20: 1000 mg via INTRAVENOUS

## 2017-12-20 MED ORDER — OXYCODONE HCL 5 MG PO TABS
5.0000 mg | ORAL_TABLET | ORAL | Status: DC | PRN
  Administered 2017-12-20 – 2017-12-21 (×3): 5 mg via ORAL
  Filled 2017-12-20 (×3): qty 1

## 2017-12-20 MED ORDER — FAMOTIDINE 20 MG PO TABS
20.0000 mg | ORAL_TABLET | Freq: Once | ORAL | Status: AC
  Administered 2017-12-20: 20 mg via ORAL

## 2017-12-20 MED ORDER — OXYCODONE HCL 5 MG PO TABS: 5.0000 mg | ORAL_TABLET | Freq: Once | ORAL | Status: DC | PRN

## 2017-12-20 MED ORDER — PHENYLEPHRINE HCL 10 MG/ML IJ SOLN
INTRAMUSCULAR | Status: DC | PRN
  Administered 2017-12-20: 35 ug/min via INTRAVENOUS

## 2017-12-20 MED ORDER — MIDAZOLAM HCL 5 MG/5ML IJ SOLN
INTRAMUSCULAR | Status: DC | PRN
  Administered 2017-12-20: 0.5 mg via INTRAVENOUS

## 2017-12-20 MED ORDER — METOCLOPRAMIDE HCL 5 MG/ML IJ SOLN: 5.0000 mg | Freq: Three times a day (TID) | INTRAMUSCULAR | Status: DC | PRN

## 2017-12-20 MED ORDER — PHENOL 1.4 % MT LIQD
1.0000 | OROMUCOSAL | Status: DC | PRN
  Filled 2017-12-20: qty 177

## 2017-12-20 MED ORDER — BISACODYL 10 MG RE SUPP
10.0000 mg | Freq: Every day | RECTAL | Status: DC | PRN
  Administered 2017-12-22: 10 mg via RECTAL
  Filled 2017-12-20: qty 1

## 2017-12-20 MED ORDER — SODIUM CHLORIDE 0.9 % IV SOLN
INTRAVENOUS | Status: DC | PRN
  Administered 2017-12-20: 19:00:00 via INTRAVENOUS

## 2017-12-20 MED ORDER — FENTANYL CITRATE (PF) 100 MCG/2ML IJ SOLN
INTRAMUSCULAR | Status: AC
  Filled 2017-12-20: qty 2

## 2017-12-20 MED ORDER — OMEGA-3-ACID ETHYL ESTERS 1 G PO CAPS
1000.0000 mg | ORAL_CAPSULE | ORAL | Status: DC
  Administered 2017-12-22: 1000 mg via ORAL
  Filled 2017-12-20: qty 1

## 2017-12-20 MED ORDER — LISINOPRIL-HYDROCHLOROTHIAZIDE 20-25 MG PO TABS: 1.0000 | ORAL_TABLET | Freq: Every day | ORAL | Status: DC

## 2017-12-20 MED ORDER — ALUM & MAG HYDROXIDE-SIMETH 200-200-20 MG/5ML PO SUSP
30.0000 mL | ORAL | Status: DC | PRN
  Filled 2017-12-20: qty 30

## 2017-12-20 MED ORDER — TRANEXAMIC ACID 1000 MG/10ML IV SOLN
1000.0000 mg | Freq: Once | INTRAVENOUS | Status: AC
  Administered 2017-12-20: 1000 mg via INTRAVENOUS
  Filled 2017-12-20: qty 10

## 2017-12-20 MED ORDER — LACTATED RINGERS IV SOLN
INTRAVENOUS | Status: DC
  Administered 2017-12-20: 14:00:00 via INTRAVENOUS

## 2017-12-20 MED ORDER — HYDROMORPHONE HCL 1 MG/ML IJ SOLN: 0.5000 mg | INTRAMUSCULAR | Status: DC | PRN

## 2017-12-20 MED ORDER — ONDANSETRON HCL 4 MG PO TABS: 4.0000 mg | ORAL_TABLET | Freq: Four times a day (QID) | ORAL | Status: DC | PRN

## 2017-12-20 MED ORDER — CHLORHEXIDINE GLUCONATE 4 % EX LIQD: 60.0000 mL | Freq: Once | CUTANEOUS | Status: DC

## 2017-12-20 MED ORDER — HYDROCHLOROTHIAZIDE 25 MG PO TABS
25.0000 mg | ORAL_TABLET | Freq: Every day | ORAL | Status: DC
  Administered 2017-12-20: 25 mg via ORAL
  Filled 2017-12-20: qty 1

## 2017-12-20 MED ORDER — TRAMADOL HCL 50 MG PO TABS
50.0000 mg | ORAL_TABLET | ORAL | Status: DC | PRN
  Administered 2017-12-21: 100 mg via ORAL
  Administered 2017-12-21 (×2): 50 mg via ORAL
  Administered 2017-12-22: 100 mg via ORAL
  Filled 2017-12-20 (×3): qty 2
  Filled 2017-12-20: qty 1

## 2017-12-20 MED ORDER — PROPOFOL 500 MG/50ML IV EMUL
INTRAVENOUS | Status: DC | PRN
  Administered 2017-12-20: 25 ug/kg/min via INTRAVENOUS

## 2017-12-20 MED ORDER — BUPIVACAINE HCL (PF) 0.5 % IJ SOLN
INTRAMUSCULAR | Status: AC
  Filled 2017-12-20: qty 10

## 2017-12-20 MED ORDER — ENOXAPARIN SODIUM 30 MG/0.3ML ~~LOC~~ SOLN
30.0000 mg | Freq: Two times a day (BID) | SUBCUTANEOUS | Status: DC
  Administered 2017-12-21 – 2017-12-22 (×3): 30 mg via SUBCUTANEOUS
  Filled 2017-12-20 (×3): qty 0.3

## 2017-12-20 MED ORDER — PHENYLEPHRINE HCL 10 MG/ML IJ SOLN
INTRAMUSCULAR | Status: AC
Start: 2017-12-20 — End: ?
  Filled 2017-12-20: qty 1

## 2017-12-20 MED ORDER — SENNOSIDES-DOCUSATE SODIUM 8.6-50 MG PO TABS
1.0000 | ORAL_TABLET | Freq: Two times a day (BID) | ORAL | Status: DC
  Administered 2017-12-20 – 2017-12-22 (×4): 1 via ORAL
  Filled 2017-12-20 (×4): qty 1

## 2017-12-20 MED ORDER — MAGNESIUM HYDROXIDE 400 MG/5ML PO SUSP
30.0000 mL | Freq: Every day | ORAL | Status: DC
  Administered 2017-12-20 – 2017-12-21 (×2): 30 mL via ORAL
  Filled 2017-12-20 (×2): qty 30

## 2017-12-20 MED ORDER — FENTANYL CITRATE (PF) 100 MCG/2ML IJ SOLN
INTRAMUSCULAR | Status: AC
  Administered 2017-12-20: 25 ug via INTRAVENOUS
  Filled 2017-12-20: qty 2

## 2017-12-20 MED ORDER — DEXAMETHASONE SODIUM PHOSPHATE 10 MG/ML IJ SOLN
INTRAMUSCULAR | Status: AC
  Filled 2017-12-20: qty 1

## 2017-12-20 MED ORDER — METOCLOPRAMIDE HCL 10 MG PO TABS
10.0000 mg | ORAL_TABLET | Freq: Three times a day (TID) | ORAL | Status: DC
  Administered 2017-12-20 – 2017-12-21 (×2): 10 mg via ORAL
  Filled 2017-12-20 (×2): qty 1

## 2017-12-20 MED ORDER — GABAPENTIN 300 MG PO CAPS
300.0000 mg | ORAL_CAPSULE | Freq: Every day | ORAL | Status: DC
  Administered 2017-12-20 – 2017-12-21 (×2): 300 mg via ORAL
  Filled 2017-12-20 (×2): qty 1

## 2017-12-20 MED ORDER — BUPIVACAINE HCL (PF) 0.5 % IJ SOLN
INTRAMUSCULAR | Status: DC | PRN
  Administered 2017-12-20: 3 mL

## 2017-12-20 MED ORDER — FENTANYL CITRATE (PF) 100 MCG/2ML IJ SOLN
25.0000 ug | INTRAMUSCULAR | Status: AC | PRN
  Administered 2017-12-20 (×6): 25 ug via INTRAVENOUS

## 2017-12-20 MED ORDER — SALINE SPRAY 0.65 % NA SOLN
1.0000 | NASAL | Status: DC | PRN
  Filled 2017-12-20: qty 44

## 2017-12-20 MED ORDER — GLYCOPYRROLATE 0.2 MG/ML IJ SOLN
INTRAMUSCULAR | Status: AC
  Filled 2017-12-20: qty 1

## 2017-12-20 MED ORDER — DIPHENHYDRAMINE HCL 12.5 MG/5ML PO ELIX: 12.5000 mg | ORAL_SOLUTION | ORAL | Status: DC | PRN

## 2017-12-20 MED ORDER — CEFAZOLIN SODIUM-DEXTROSE 2-4 GM/100ML-% IV SOLN
2.0000 g | Freq: Four times a day (QID) | INTRAVENOUS | Status: AC
  Administered 2017-12-20 – 2017-12-21 (×4): 2 g via INTRAVENOUS
  Filled 2017-12-20 (×5): qty 100

## 2017-12-20 MED ORDER — VITAMIN D 1000 UNITS PO TABS
5000.0000 [IU] | ORAL_TABLET | Freq: Every day | ORAL | Status: DC
  Administered 2017-12-21 – 2017-12-22 (×2): 5000 [IU] via ORAL
  Filled 2017-12-20 (×2): qty 5

## 2017-12-20 MED ORDER — FENTANYL CITRATE (PF) 100 MCG/2ML IJ SOLN
INTRAMUSCULAR | Status: DC | PRN
  Administered 2017-12-20: 25 ug via INTRAVENOUS

## 2017-12-20 MED ORDER — MENTHOL 3 MG MT LOZG
1.0000 | LOZENGE | OROMUCOSAL | Status: DC | PRN
  Filled 2017-12-20: qty 9

## 2017-12-20 MED ORDER — NEOMYCIN-POLYMYXIN B GU 40-200000 IR SOLN
Status: DC | PRN
  Administered 2017-12-20: 14 mL

## 2017-12-20 MED ORDER — ACETAMINOPHEN 325 MG PO TABS: 325.0000 mg | ORAL_TABLET | Freq: Four times a day (QID) | ORAL | Status: DC | PRN

## 2017-12-20 MED ORDER — ACETAMINOPHEN 10 MG/ML IV SOLN
1000.0000 mg | Freq: Four times a day (QID) | INTRAVENOUS | Status: AC
Start: 2017-12-21 — End: 2017-12-21
  Administered 2017-12-21 (×3): 1000 mg via INTRAVENOUS
  Filled 2017-12-20 (×9): qty 100

## 2017-12-20 MED ORDER — CEFAZOLIN SODIUM-DEXTROSE 2-3 GM-%(50ML) IV SOLR
INTRAVENOUS | Status: DC | PRN
  Administered 2017-12-20: 2 g via INTRAVENOUS

## 2017-12-20 MED ORDER — PROPOFOL 500 MG/50ML IV EMUL
INTRAVENOUS | Status: AC
  Filled 2017-12-20: qty 50

## 2017-12-20 MED ORDER — GLYCOPYRROLATE 0.2 MG/ML IJ SOLN
INTRAMUSCULAR | Status: DC | PRN
  Administered 2017-12-20 (×2): 0.1 mg via INTRAVENOUS

## 2017-12-20 MED ORDER — CALCIUM CARBONATE ANTACID 500 MG PO CHEW
1500.0000 mg | CHEWABLE_TABLET | Freq: Every day | ORAL | Status: DC
  Administered 2017-12-21 – 2017-12-22 (×2): 1500 mg via ORAL

## 2017-12-20 MED ORDER — OXYCODONE HCL 5 MG PO TABS: 10.0000 mg | ORAL_TABLET | ORAL | Status: DC | PRN

## 2017-12-20 MED ORDER — VITAMIN E 180 MG (400 UNIT) PO CAPS
400.0000 [IU] | ORAL_CAPSULE | Freq: Every day | ORAL | Status: DC
  Administered 2017-12-21 – 2017-12-22 (×2): 400 [IU] via ORAL
  Filled 2017-12-20 (×2): qty 1

## 2017-12-20 MED ORDER — MIDAZOLAM HCL 2 MG/2ML IJ SOLN
INTRAMUSCULAR | Status: AC
  Filled 2017-12-20: qty 2

## 2017-12-20 MED ORDER — GABAPENTIN 300 MG PO CAPS
ORAL_CAPSULE | ORAL | Status: AC
  Filled 2017-12-20: qty 1

## 2017-12-20 MED ORDER — METOCLOPRAMIDE HCL 10 MG PO TABS: 5.0000 mg | ORAL_TABLET | Freq: Three times a day (TID) | ORAL | Status: DC | PRN

## 2017-12-20 MED ORDER — POTASSIUM 99 MG PO TABS: 99.0000 mg | ORAL_TABLET | Freq: Every day | ORAL | Status: DC

## 2017-12-20 MED ORDER — FAMOTIDINE 20 MG PO TABS
ORAL_TABLET | ORAL | Status: AC
  Filled 2017-12-20: qty 1

## 2017-12-20 MED ORDER — ONDANSETRON HCL 4 MG/2ML IJ SOLN: 4.0000 mg | Freq: Four times a day (QID) | INTRAMUSCULAR | Status: DC | PRN

## 2017-12-20 MED ORDER — LABETALOL HCL 5 MG/ML IV SOLN
5.0000 mg | INTRAVENOUS | Status: DC | PRN
  Administered 2017-12-20 (×3): 5 mg via INTRAVENOUS

## 2017-12-20 MED ORDER — SODIUM CHLORIDE 0.9 % IV SOLN
INTRAVENOUS | Status: DC
  Administered 2017-12-20 – 2017-12-21 (×2): via INTRAVENOUS

## 2017-12-20 MED ORDER — PROPOFOL 500 MG/50ML IV EMUL
INTRAVENOUS | Status: AC
Start: 2017-12-20 — End: ?
  Filled 2017-12-20: qty 50

## 2017-12-20 MED ORDER — PANTOPRAZOLE SODIUM 40 MG PO TBEC
40.0000 mg | DELAYED_RELEASE_TABLET | Freq: Two times a day (BID) | ORAL | Status: DC
  Administered 2017-12-20 – 2017-12-22 (×4): 40 mg via ORAL
  Filled 2017-12-20 (×4): qty 1

## 2017-12-20 MED ORDER — LISINOPRIL 20 MG PO TABS
20.0000 mg | ORAL_TABLET | Freq: Every day | ORAL | Status: DC
Start: 2017-12-20 — End: 2017-12-22
  Administered 2017-12-20: 20 mg via ORAL
  Filled 2017-12-20: qty 1

## 2017-12-20 MED ORDER — OXYCODONE HCL 5 MG/5ML PO SOLN: 5.0000 mg | Freq: Once | ORAL | Status: DC | PRN

## 2017-12-20 MED ORDER — FERROUS SULFATE 325 (65 FE) MG PO TABS
325.0000 mg | ORAL_TABLET | Freq: Two times a day (BID) | ORAL | Status: DC
  Administered 2017-12-21 – 2017-12-22 (×2): 325 mg via ORAL
  Filled 2017-12-20 (×2): qty 1

## 2017-12-20 MED ORDER — ACETAMINOPHEN 10 MG/ML IV SOLN
INTRAVENOUS | Status: AC
Start: 2017-12-20 — End: ?
  Filled 2017-12-20: qty 100

## 2017-12-20 MED ORDER — LABETALOL HCL 5 MG/ML IV SOLN
INTRAVENOUS | Status: AC
  Administered 2017-12-20: 5 mg via INTRAVENOUS
  Filled 2017-12-20: qty 4

## 2017-12-20 SURGICAL SUPPLY — 52 items
BLADE DRUM FLTD (BLADE) ×3 IMPLANT
BLADE SAW 1 (BLADE) ×3 IMPLANT
CANISTER SUCT 1200ML W/VALVE (MISCELLANEOUS) ×3 IMPLANT
CANISTER SUCT 3000ML PPV (MISCELLANEOUS) ×6 IMPLANT
CAPT HIP TOTAL 2 ×3 IMPLANT
CARTRIDGE OIL MAESTRO DRILL (MISCELLANEOUS) ×1 IMPLANT
DIFFUSER DRILL AIR PNEUMATIC (MISCELLANEOUS) ×3 IMPLANT
DRAPE INCISE IOBAN 66X60 STRL (DRAPES) ×3 IMPLANT
DRAPE SHEET LG 3/4 BI-LAMINATE (DRAPES) ×3 IMPLANT
DRSG DERMACEA 8X12 NADH (GAUZE/BANDAGES/DRESSINGS) ×3 IMPLANT
DRSG OPSITE POSTOP 4X12 (GAUZE/BANDAGES/DRESSINGS) ×3 IMPLANT
DRSG OPSITE POSTOP 4X14 (GAUZE/BANDAGES/DRESSINGS) IMPLANT
DRSG TEGADERM 4X4.75 (GAUZE/BANDAGES/DRESSINGS) ×3 IMPLANT
DURAPREP 26ML APPLICATOR (WOUND CARE) ×3 IMPLANT
ELECT BLADE 6.5 EXT (BLADE) ×3 IMPLANT
ELECT CAUTERY BLADE 6.4 (BLADE) ×3 IMPLANT
GLOVE BIOGEL M STRL SZ7.5 (GLOVE) ×6 IMPLANT
GLOVE BIOGEL PI IND STRL 9 (GLOVE) ×1 IMPLANT
GLOVE BIOGEL PI INDICATOR 9 (GLOVE) ×2
GLOVE INDICATOR 8.0 STRL GRN (GLOVE) ×3 IMPLANT
GLOVE SURG SYN 9.0  PF PI (GLOVE) ×2
GLOVE SURG SYN 9.0 PF PI (GLOVE) ×1 IMPLANT
GOWN STRL REUS W/ TWL LRG LVL3 (GOWN DISPOSABLE) ×2 IMPLANT
GOWN STRL REUS W/TWL 2XL LVL3 (GOWN DISPOSABLE) ×3 IMPLANT
GOWN STRL REUS W/TWL LRG LVL3 (GOWN DISPOSABLE) ×4
HEMOVAC 400CC 10FR (MISCELLANEOUS) ×3 IMPLANT
HOLDER FOLEY CATH W/STRAP (MISCELLANEOUS) ×3 IMPLANT
HOOD PEEL AWAY FLYTE STAYCOOL (MISCELLANEOUS) ×6 IMPLANT
KIT TURNOVER KIT A (KITS) ×3 IMPLANT
NDL SAFETY ECLIPSE 18X1.5 (NEEDLE) ×1 IMPLANT
NEEDLE HYPO 18GX1.5 SHARP (NEEDLE) ×2
NS IRRIG 500ML POUR BTL (IV SOLUTION) ×3 IMPLANT
OIL CARTRIDGE MAESTRO DRILL (MISCELLANEOUS) ×3
PACK HIP PROSTHESIS (MISCELLANEOUS) ×3 IMPLANT
PULSAVAC PLUS IRRIG FAN TIP (DISPOSABLE) ×3
SOL .9 NS 3000ML IRR  AL (IV SOLUTION) ×2
SOL .9 NS 3000ML IRR UROMATIC (IV SOLUTION) ×1 IMPLANT
SOL PREP PVP 2OZ (MISCELLANEOUS) ×3
SOLUTION PREP PVP 2OZ (MISCELLANEOUS) ×1 IMPLANT
SPONGE DRAIN TRACH 4X4 STRL 2S (GAUZE/BANDAGES/DRESSINGS) ×3 IMPLANT
STAPLER SKIN PROX 35W (STAPLE) ×3 IMPLANT
SUT ETHIBOND #5 BRAIDED 30INL (SUTURE) ×3 IMPLANT
SUT VIC AB 0 CT1 36 (SUTURE) ×3 IMPLANT
SUT VIC AB 1 CT1 36 (SUTURE) ×6 IMPLANT
SUT VIC AB 2-0 CT1 27 (SUTURE) ×2
SUT VIC AB 2-0 CT1 TAPERPNT 27 (SUTURE) ×1 IMPLANT
SYR 20CC LL (SYRINGE) ×3 IMPLANT
TAPE ADH 3 LX (MISCELLANEOUS) ×3 IMPLANT
TAPE TRANSPORE STRL 2 31045 (GAUZE/BANDAGES/DRESSINGS) ×3 IMPLANT
TIP FAN IRRIG PULSAVAC PLUS (DISPOSABLE) ×1 IMPLANT
TOWEL OR 17X26 4PK STRL BLUE (TOWEL DISPOSABLE) ×3 IMPLANT
TRAY FOLEY W/METER SILVER 16FR (SET/KITS/TRAYS/PACK) ×3 IMPLANT

## 2017-12-20 NOTE — Anesthesia Procedure Notes (Signed)
Spinal  Patient location during procedure: OR Start time: 12/20/2017 3:40 PM End time: 12/20/2017 4:26 PM Staffing Anesthesiologist: Molli Barrows, MD Resident/CRNA: Dionne Bucy, CRNA Performed: resident/CRNA  Preanesthetic Checklist Completed: patient identified, site marked, surgical consent, pre-op evaluation, timeout performed, IV checked, risks and benefits discussed and monitors and equipment checked Spinal Block Patient position: sitting Prep: ChloraPrep Patient monitoring: heart rate, continuous pulse ox, blood pressure and cardiac monitor Approach: midline Location: L4-5 Injection technique: single-shot Needle Needle type: Introducer and Pencan  Needle gauge: 24 G Needle length: 10 cm Assessment Sensory level: T6 Additional Notes Negative paresthesia. Negative blood return. Positive free-flowing CSF. Expiration date of kit checked and confirmed. Patient tolerated procedure well, without complications.

## 2017-12-20 NOTE — H&P (Signed)
The patient has been re-examined, and the chart reviewed, and there have been no interval changes to the documented history and physical.    The risks, benefits, and alternatives have been discussed at length. The patient expressed understanding of the risks benefits and agreed with plans for surgical intervention.  Sherry Morales, Jr. M.D.    

## 2017-12-20 NOTE — Anesthesia Post-op Follow-up Note (Signed)
Anesthesia QCDR form completed.        

## 2017-12-20 NOTE — Transfer of Care (Signed)
Immediate Anesthesia Transfer of Care Note  Patient: Sherry Morales  Procedure(s) Performed: TOTAL HIP ARTHROPLASTY (Right )  Patient Location: PACU  Anesthesia Type:Spinal  Level of Consciousness: awake, alert , oriented and patient cooperative  Airway & Oxygen Therapy: Patient Spontanous Breathing  Post-op Assessment: Report given to RN and Post -op Vital signs reviewed and stable  Post vital signs: Reviewed and stable  Last Vitals:  Vitals Value Taken Time  BP    Temp    Pulse 86 12/20/2017  7:15 PM  Resp 16 12/20/2017  7:15 PM  SpO2 94 % 12/20/2017  7:15 PM  Vitals shown include unvalidated device data.  Last Pain:  Vitals:   12/20/17 1359  TempSrc: Temporal  PainSc: 0-No pain         Complications: No apparent anesthesia complications

## 2017-12-20 NOTE — Discharge Instructions (Signed)
Instructions after Total Hip Replacement ° ° °  Vivion Romano P. Landis Cassaro, Jr., M.D.    ° Dept. of Orthopaedics & Sports Medicine ° [facility] ° [address] ° [place], [place]  [address] ° Phone: [phone]   Fax: [phone] ° °  °DIET: °• Drink plenty of non-alcoholic fluids. °• Resume your normal diet. Include foods high in fiber. ° °ACTIVITY:  °• You may use crutches or a walker with weight-bearing as tolerated, unless instructed otherwise. °• You may be weaned off of the walker or crutches by your Physical Therapist.  °• Do NOT reach below the level of your knees or cross your legs until allowed.    °• Continue doing gentle exercises. Exercising will reduce the pain and swelling, increase motion, and prevent muscle weakness.   °• Please continue to use the TED compression stockings for 6 weeks. You may remove the stockings at night, but should reapply them in the morning. °• Do not drive or operate any equipment until instructed. ° °WOUND CARE:  °• Continue to use ice packs periodically to reduce pain and swelling. °• Keep the incision clean and dry. °• You may bathe or shower after the staples are removed at the first office visit following surgery. ° °MEDICATIONS: °• You may resume your regular medications. °• Please take the pain medication as prescribed on the medication. °• Do not take pain medication on an empty stomach. °• You have been given a prescription for a blood thinner to prevent blood clots. Please take the medication as instructed. (NOTE: After completing a 2 week course of Lovenox, take one Enteric-coated aspirin once a day.) °• Pain medications and iron supplements can cause constipation. Use a stool softener (Senokot or Colace) on a daily basis and a laxative (dulcolax or miralax) as needed. °• Do not drive or drink alcoholic beverages when taking pain medications. ° °CALL THE OFFICE FOR: °• Temperature above 101 degrees °• Excessive bleeding or drainage on the dressing. °• Excessive  swelling, coldness, or paleness of the toes. °• Persistent nausea and vomiting. ° °FOLLOW-UP:  °• You should have an appointment to return to the office in 6 weeks after surgery. °• Arrangements have been made for continuation of Physical Therapy (either home therapy or outpatient therapy). °  °

## 2017-12-20 NOTE — Anesthesia Procedure Notes (Signed)
Performed by: Devera Englander, CRNA       

## 2017-12-20 NOTE — Anesthesia Preprocedure Evaluation (Signed)
Anesthesia Evaluation  Patient identified by MRN, date of birth, ID band Patient awake    Reviewed: Allergy & Precautions, H&P , NPO status , Patient's Chart, lab work & pertinent test results  History of Anesthesia Complications Negative for: history of anesthetic complications  Airway Mallampati: III  TM Distance: <3 FB Neck ROM: limited    Dental  (+) Chipped, Poor Dentition   Pulmonary neg pulmonary ROS, neg shortness of breath,           Cardiovascular Exercise Tolerance: Good hypertension, (-) angina(-) Past MI and (-) DOE      Neuro/Psych PSYCHIATRIC DISORDERS Anxiety  Neuromuscular disease    GI/Hepatic negative GI ROS, Neg liver ROS,   Endo/Other  negative endocrine ROS  Renal/GU CRFRenal disease     Musculoskeletal  (+) Arthritis ,   Abdominal   Peds  Hematology negative hematology ROS (+)   Anesthesia Other Findings Past Medical History: No date: Anxiety No date: Arthritis No date: Chronic kidney disease     Comment:  stage III No date: Hypertension No date: Lipidemia     Comment:  mild No date: Neuropathy  Past Surgical History: No date: Breast biopsy; Left No date: CHOLECYSTECTOMY No date: LAMINECTOMY No date: TOTAL VAGINAL HYSTERECTOMY  BMI    Body Mass Index:  33.95 kg/m      Reproductive/Obstetrics negative OB ROS                             Anesthesia Physical Anesthesia Plan  ASA: III  Anesthesia Plan: Spinal   Post-op Pain Management:    Induction:   PONV Risk Score and Plan:   Airway Management Planned: Natural Airway and Nasal Cannula  Additional Equipment:   Intra-op Plan:   Post-operative Plan:   Informed Consent: I have reviewed the patients History and Physical, chart, labs and discussed the procedure including the risks, benefits and alternatives for the proposed anesthesia with the patient or authorized representative who has  indicated his/her understanding and acceptance.   Dental Advisory Given  Plan Discussed with: Anesthesiologist, CRNA and Surgeon  Anesthesia Plan Comments: (Patient reports no bleeding problems and no anticoagulant use.  Plan for spinal with backup GA  Patient consented for risks of anesthesia including but not limited to:  - adverse reactions to medications - risk of bleeding, infection, nerve damage and headache - risk of failed spinal - damage to teeth, lips or other oral mucosa - sore throat or hoarseness - Damage to heart, brain, lungs or loss of life  Patient voiced understanding.)        Anesthesia Quick Evaluation

## 2017-12-20 NOTE — Op Note (Addendum)
OPERATIVE NOTE  DATE OF SURGERY:  12/20/2017  PATIENT NAME:  Sherry Morales   DOB: Oct 24, 1930  MRN: [id number]  PRE-OPERATIVE DIAGNOSIS: Degenerative arthrosis of the right hip, primary  POST-OPERATIVE DIAGNOSIS:  Same  PROCEDURE:  Right total hip arthroplasty  SURGEON:  Jena Gauss. M.D.  ASSISTANT:  Van Clines, PA (present and scrubbed throughout the case, critical for assistance with exposure, retraction, instrumentation, and closure)  ANESTHESIA: spinal  ESTIMATED BLOOD LOSS: 250 mL  FLUIDS REPLACED: 950 mL of crystalloid  DRAINS: 2 medium drains to a Hemovac reservoir  IMPLANTS UTILIZED: DePuy 15 mm small stature AML femoral stem, 52 mm OD Pinnacle 100 acetabular component, +4 mm neutral Pinnacle Marathon polyethylene insert, and a 36 mm M-SPEC +1.5 mm hip ball  INDICATIONS FOR SURGERY: Sherry Morales is a 84 y.o. year old female with a long history of progressive hip and groin  pain. X-rays demonstrated severe degenerative changes. The patient had not seen any significant improvement despite conservative nonsurgical intervention. After discussion of the risks and benefits of surgical intervention, the patient expressed understanding of the risks benefits and agree with plans for total hip arthroplasty.   The risks, benefits, and alternatives were discussed at length including but not limited to the risks of infection, bleeding, nerve injury, stiffness, blood clots, the need for revision surgery, limb length inequality, dislocation, cardiopulmonary complications, among others, and they were willing to proceed.  PROCEDURE IN DETAIL: The patient was brought into the operating room and, after adequate spinal anesthesia was achieved, the patient was placed in a left lateral decubitus position. Axillary roll was placed and all bony prominences were well-padded. The patient's right hip was cleaned and prepped with alcohol and DuraPrep and draped in the usual  sterile fashion. A "timeout" was performed as per usual protocol. A lateral curvilinear incision was made gently curving towards the posterior superior iliac spine. The IT band was incised in line with the skin incision and the fibers of the gluteus maximus were split in line. The piriformis tendon was identified, skeletonized, and incised at its insertion to the proximal femur and reflected posteriorly. A T type posterior capsulotomy was performed. Prior to dislocation of the femoral head, a threaded Steinmann pin was inserted through a separate stab incision into the pelvis superior to the acetabulum and bent in the form of a stylus so as to assess limb length and hip offset throughout the procedure. The femoral head was then dislocated posteriorly. Inspection of the femoral head demonstrated severe degenerative changes with full-thickness loss of articular cartilage. The femoral neck cut was performed using an oscillating saw. The anterior capsule was elevated off of the femoral neck using a periosteal elevator. Attention was then directed to the acetabulum.  The gluteal sling was incised so as to better mobilize the proximal femur.  The remnant of the labrum was excised using electrocautery. Inspection of the acetabulum also demonstrated significant degenerative changes. The acetabulum was reamed in sequential fashion up to a 51 mm diameter. Good punctate bleeding bone was encountered. A 52 mm Pinnacle 100 acetabular component was positioned and impacted into place. Good scratch fit was appreciated. A +4 mm neutral polyethylene trial was inserted.  Attention was then directed to the proximal femur. A hole for reaming of the proximal femoral canal was created using a high-speed burr. The femoral canal was reamed in sequential fashion up to a 14.5 mm diameter. This allowed for approximately 7 cm of scratch fit.  It was thus  elected to ream up to a 15 mm diameter to allow for a line to line fit.  Serial broaches  were inserted up to a 15 mm small stature femoral broach. Calcar region was planed and a trial reduction was performed using a 36 mm hip ball with a +1.5 mm neck length. Good equalization of limb lengths and hip offset was appreciated and excellent stability was noted both anteriorly and posteriorly. Trial components were removed. The acetabular shell was irrigated with copious amounts of normal saline with antibiotic solution and suctioned dry. A +4 mm neutral Pinnacle Marathon polyethylene insert was positioned and impacted into place. Next, a 15 mm small stature AML femoral stem was positioned and impacted into place. Excellent scratch fit was appreciated. A trial reduction was again performed with a 36 mm hip ball with a +1.5 mm neck length. Again, good equalization of limb lengths was appreciated and excellent stability appreciated both anteriorly and posteriorly. The hip was then dislocated and the trial hip ball was removed. The Morse taper was cleaned and dried. A 36 mm M-SPEC hip ball with a +1.5 mm neck length was placed on the trunnion and impacted into place. The hip was then reduced and placed through range of motion. Excellent stability was appreciated both anteriorly and posteriorly.  The wound was irrigated with copious amounts of normal saline with antibiotic solution and suctioned dry. Good hemostasis was appreciated. The posterior capsulotomy was repaired using #5 Ethibond. Piriformis tendon was reapproximated to the undersurface of the gluteus medius tendon using #5 Ethibond.  The gluteal sling was also repaired with interrupted sutures of #5 Ethibond.  Two medium drains were placed in the wound bed and brought out through separate stab incisions to be attached to a Hemovac reservoir. The IT band was reapproximated using interrupted sutures of #1 Vicryl. Subcutaneous tissue was approximated using first #0 Vicryl followed by #2-0 Vicryl. The skin was closed with skin staples.  The patient  tolerated the procedure well and was transported to the recovery room in stable condition.   Jena Gauss., M.D.

## 2017-12-21 ENCOUNTER — Encounter
Admission: RE | Admit: 2017-12-21 | Discharge: 2017-12-21 | Disposition: A | Discharge status: Home / Self Care | Payer: Medicare Other | Source: Ambulatory Visit | Attending: Internal Medicine | Admitting: Internal Medicine

## 2017-12-21 ENCOUNTER — Encounter: Payer: Self-pay | Admitting: Orthopedic Surgery

## 2017-12-21 MED ORDER — OXYCODONE HCL 5 MG PO TABS: 5.0000 mg | ORAL_TABLET | ORAL | 0 refills | Status: DC | PRN

## 2017-12-21 MED ORDER — SODIUM CHLORIDE 0.9 % IV BOLUS: 500.0000 mL | Freq: Once | INTRAVENOUS | Status: DC

## 2017-12-21 MED ORDER — TRAMADOL HCL 50 MG PO TABS: 50.0000 mg | ORAL_TABLET | ORAL | 0 refills | Status: DC | PRN

## 2017-12-21 MED ORDER — ENOXAPARIN SODIUM 30 MG/0.3ML ~~LOC~~ SOLN: 30.0000 mg | Freq: Two times a day (BID) | SUBCUTANEOUS | 0 refills | Status: DC

## 2017-12-21 NOTE — Progress Notes (Signed)
Physical Therapy Treatment Patient Details Name: Sherry Morales MRN: [id number] DOB: [birth year] Today's Date: 12/21/2017    History of Present Illness Pt is an 84 yo F diagnosed with degenerative arthrosis of the right hip and is s/p elective R THA.  PMH includes: anxiety, CKC stage III, HTN, and neuropathy.      PT Comments    Pt presents with deficits in strength, transfers, mobility, gait, balance, and activity tolerance.  Pt's baseline vitals on 2LO2/min included BP 119/57 mmHg and SpO2 98% with HR WNL throughout session.  Per nursing ok for trial wean on room air with activity.  Pt tolerated below therex on room air with occasional cues for breathing technique to prevent holding breath during exercises with SpO2 remaining >/= 93%.  During transfer training and amb pt's SpO2 dropped to 88-89% with pt returned to sitting and put on 2LO2/min.  Pt also c/o feeling "a little dizzy" with BP taken at 88/82 mmHg, nursing notified with no further amb attempted secondary to symptomatic drop in BP.  Pt will benefit from PT services in a SNF setting upon discharge to safely address above deficits for decreased caregiver assistance and eventual return to PLOF.     Follow Up Recommendations  SNF;Supervision for mobility/OOB     Equipment Recommendations  None recommended by PT    Recommendations for Other Services       Precautions / Restrictions Precautions Precautions: Posterior Hip;Fall Precaution Booklet Issued: Yes (comment) Precaution Comments: Posterior hip precaution education provided to pt and family with pt recalling 2/3 hip precautions  Restrictions Weight Bearing Restrictions: Yes RLE Weight Bearing: Weight bearing as tolerated    Mobility  Bed Mobility Overal bed mobility: Needs Assistance Bed Mobility: Supine to Sit     Supine to sit: Min assist     General bed mobility comments: NT, pt in recliner  Transfers Overall transfer level: Needs  assistance Equipment used: Rolling walker (2 wheeled) Transfers: Sit to/from Stand Sit to Stand: From elevated surface;Min guard         General transfer comment: Mod VC for sequencing to maintain precautions  Ambulation/Gait Ambulation/Gait assistance: Min guard Ambulation Distance (Feet): 4 Feet Assistive device: Rolling walker (2 wheeled) Gait Pattern/deviations: Step-to pattern;Decreased stance time - right;Decreased step length - left Gait velocity: Decreased   General Gait Details: Pt c/o feeling mildly dizzy after amb. Pt returned to sitting with BP taken at 84/82 mmHg, nursing notified.     Stairs             Wheelchair Mobility    Modified Rankin (Stroke Patients Only)       Balance Overall balance assessment: Needs assistance   Sitting balance-Leahy Scale: Good     Standing balance support: Bilateral upper extremity supported Standing balance-Leahy Scale: Fair Standing balance comment: No posterior instability in standing this session but heavy reliance on BUE on RW for support                            Cognition Arousal/Alertness: Awake/alert Behavior During Therapy: WFL for tasks assessed/performed Overall Cognitive Status: Within Functional Limits for tasks assessed                                        Exercises Total Joint Exercises Ankle Circles/Pumps: Strengthening;Both;10 reps Quad Sets: Strengthening;Both;10 reps Gluteal Sets: Strengthening;Both;10 reps Long  Arc Quad: AROM;Both;10 reps;15 reps Knee Flexion: AROM;Both;10 reps;15 reps Marching in Standing: AROM;Both;5 reps Other Exercises Other Exercises: HEP education and review Other Exercises: Posterior hip precaution education and review verbally and during functional tasks    General Comments        Pertinent Vitals/Pain Pain Assessment: 0-10 Pain Score: 5  Pain Location: R hip Pain Descriptors / Indicators: Sore;Aching Pain Intervention(s):  Premedicated before session;Monitored during session    Home Living Family/patient expects to be discharged to:: Private residence Living Arrangements: Alone Available Help at Discharge: Family;Available 24 hours/day Type of Home: House Home Access: Stairs to enter Entrance Stairs-Rails: None Home Layout: One level Home Equipment: Cane - single point;Walker - 2 wheels;Walker - 4 wheels;Toilet riser;Shower seat;Adaptive equipment      Prior Function Level of Independence: Independent with assistive device(s)      Comments: Mod Ind amb limited community distances with a rollator, Ind with ADLs, no fall history, driving   PT Goals (current goals can now be found in the care plan section) Acute Rehab PT Goals Patient Stated Goal: To walk better Progress towards PT goals: Not progressing toward goals - comment(Limited by orthostatic BP)    Frequency    BID      PT Plan Current plan remains appropriate    Co-evaluation              AM-PAC PT "6 Clicks" Daily Activity  Outcome Measure                   End of Session Equipment Utilized During Treatment: Gait belt Activity Tolerance: Treatment limited secondary to medical complications (Comment)(Orthostatic) Patient left: with call bell/phone within reach;with SCD's reapplied;in chair;with chair alarm set;with nursing/sitter in room;with family/visitor present Nurse Communication: Mobility status;Other (comment)(Orthostatic and desaturation on room air ) PT Visit Diagnosis: Other abnormalities of gait and mobility (R26.89);Muscle weakness (generalized) (M62.81)     Time: 1610-9604 PT Time Calculation (min) (ACUTE ONLY): 28 min  Charges:  $Therapeutic Exercise: 8-22 mins $Therapeutic Activity: 8-22 mins                    G Codes:       DElly Modena PT, DPT 12/21/17, 4:23 PM

## 2017-12-21 NOTE — NC FL2 (Signed)
[place] MEDICAID FL2 LEVEL OF CARE SCREENING TOOL     IDENTIFICATION  Patient Name: Sherry Morales Birthdate: 02/01/1931 Sex: female Admission Date (Current Location): 12/20/2017  [place] and [place] Number:  Chiropodist and Address:  [facility], [address], [place], [place] [address]      Provider Number: [id number]  Attending Physician Name and Address:  Donato Heinz, MD  Relative Name and Phone Number:       Current Level of Care: Hospital Recommended Level of Care: Skilled Nursing Facility Prior Approval Number:    Date Approved/Denied:   PASRR Number: [id number])  Discharge Plan: SNF    Current Diagnoses: Patient Active Problem List   Diagnosis Date Noted  . Status post total replacement of hip 12/20/2017  . Primary osteoarthritis of right hip 03/26/2017  . CKD (chronic kidney disease), stage III (HCC) 12/20/2015  . Essential (primary) hypertension 06/04/2015  . Mild hyperlipidemia 06/04/2015  . Muscle spasms of head and/or neck 06/04/2015  . Neuropathy 06/04/2015  . Generalized OA 06/04/2015  . Tendinitis of wrist 06/04/2015    Orientation RESPIRATION BLADDER Height & Weight     Self, Time, Situation, Place  Normal Continent Weight: 204 lb (92.5 kg) Height:   (165.1 cm)  BEHAVIORAL SYMPTOMS/MOOD NEUROLOGICAL BOWEL NUTRITION STATUS      Continent Diet(Diet: Regular )  AMBULATORY STATUS COMMUNICATION OF NEEDS Skin   Extensive Assist Verbally Surgical wounds(Incision: Right Hip. )                       Personal Care Assistance Level of Assistance  Bathing, Feeding, Dressing Bathing Assistance: Limited assistance Feeding assistance: Independent Dressing Assistance: Limited assistance     Functional Limitations Info  Sight, Hearing, Speech Sight Info: Adequate Hearing Info: Adequate Speech Info: Adequate    SPECIAL CARE FACTORS FREQUENCY  PT (By licensed PT), OT (By licensed OT)      PT Frequency: (5) OT Frequency: (5)            Contractures      Additional Factors Info  Code Status, Allergies Code Status Info: (Full Code. ) Allergies Info: (Celecoxib, Pregabalin)           Current Medications (12/21/2017):  This is the current hospital active medication list Current Facility-Administered Medications  Medication Dose Route Frequency Provider Last Rate Last Dose  . 0.9 %  sodium chloride infusion   Intravenous Continuous Hooten, Illene Labrador, MD 100 mL/hr at 12/21/17 0559    . acetaminophen (OFIRMEV) IV 1,000 mg  1,000 mg Intravenous Q6H Hooten, Illene Labrador, MD   Stopped at 12/21/17 [phone]  . acetaminophen (TYLENOL) tablet 325-650 mg  325-650 mg Oral Q6H PRN Hooten, Illene Labrador, MD      . alum & mag hydroxide-simeth (MAALOX/MYLANTA) 200-200-20 MG/5ML suspension 30 mL  30 mL Oral Q4H PRN Hooten, Illene Labrador, MD      . bisacodyl (DULCOLAX) suppository 10 mg  10 mg Rectal Daily PRN Hooten, Illene Labrador, MD      . calcium carbonate (TUMS - dosed in mg elemental calcium) chewable tablet 1,500 mg  1,500 mg Oral Daily Hooten, Illene Labrador, MD   1,500 mg at 12/21/17 0813  . ceFAZolin (ANCEF) IVPB 2g/100 mL premix  2 g Intravenous Q6H Hooten, Illene Labrador, MD   Stopped at 12/21/17 0341  . cholecalciferol (VITAMIN D) tablet 5,000 Units  5,000 Units Oral Daily Hooten, Illene Labrador, MD   5,000 Units  at 12/21/17 1610  . diphenhydrAMINE (BENADRYL) 12.5 MG/5ML elixir 12.5-25 mg  12.5-25 mg Oral Q4H PRN Hooten, Illene Labrador, MD      . enoxaparin (LOVENOX) injection 30 mg  30 mg Subcutaneous Q12H Hooten, Illene Labrador, MD   30 mg at 12/21/17 9604  . ferrous sulfate tablet 325 mg  325 mg Oral BID WC Donato Heinz, MD   325 mg at 12/21/17 0811  . gabapentin (NEURONTIN) capsule 300 mg  300 mg Oral QHS Hooten, Illene Labrador, MD   300 mg at 12/20/17 2209  . lisinopril (PRINIVIL,ZESTRIL) tablet 20 mg  20 mg Oral Daily Hooten, Illene Labrador, MD   20 mg at 12/20/17 2210   And  . hydrochlorothiazide (HYDRODIURIL) tablet 25 mg  25 mg Oral  Daily Hooten, Illene Labrador, MD   25 mg at 12/20/17 2210  . HYDROmorphone (DILAUDID) injection 0.5-1 mg  0.5-1 mg Intravenous Q4H PRN Hooten, Illene Labrador, MD      . magnesium hydroxide (MILK OF MAGNESIA) suspension 30 mL  30 mL Oral Daily Hooten, Illene Labrador, MD   30 mL at 12/21/17 0812  . menthol-cetylpyridinium (CEPACOL) lozenge 3 mg  1 lozenge Oral PRN Hooten, Illene Labrador, MD       Or  . phenol (CHLORASEPTIC) mouth spray 1 spray  1 spray Mouth/Throat PRN Hooten, Illene Labrador, MD      . metoCLOPramide (REGLAN) tablet 5-10 mg  5-10 mg Oral Q8H PRN Hooten, Illene Labrador, MD       Or  . metoCLOPramide (REGLAN) injection 5-10 mg  5-10 mg Intravenous Q8H PRN Hooten, Illene Labrador, MD      . metoCLOPramide (REGLAN) tablet 10 mg  10 mg Oral TID AC & HS Hooten, Illene Labrador, MD   10 mg at 12/20/17 2209  . [START ON 12/22/2017] omega-3 acid ethyl esters (LOVAZA) capsule 1,000 mg  1,000 mg Oral Once per day on Mon Wed Fri Hooten, Illene Labrador, MD      . ondansetron [facility]) tablet 4 mg  4 mg Oral Q6H PRN Hooten, Illene Labrador, MD       Or  . ondansetron (ZOFRAN) injection 4 mg  4 mg Intravenous Q6H PRN Hooten, Illene Labrador, MD      . oxyCODONE (Oxy IR/ROXICODONE) immediate release tablet 10 mg  10 mg Oral Q4H PRN Hooten, Illene Labrador, MD      . oxyCODONE (Oxy IR/ROXICODONE) immediate release tablet 5 mg  5 mg Oral Q4H PRN Donato Heinz, MD   5 mg at 12/21/17 0811  . pantoprazole (PROTONIX) EC tablet 40 mg  40 mg Oral BID Donato Heinz, MD   40 mg at 12/21/17 0811  . senna-docusate (Senokot-S) tablet 1 tablet  1 tablet Oral BID Donato Heinz, MD   1 tablet at 12/21/17 [phone]  . sodium chloride (OCEAN) 0.65 % nasal spray 1 spray  1 spray Each Nare PRN Hooten, Illene Labrador, MD      . sodium phosphate (FLEET) 7-19 GM/118ML enema 1 enema  1 enema Rectal Once PRN Hooten, Illene Labrador, MD      . traMADol Janean Sark) tablet 50-100 mg  50-100 mg Oral Q4H PRN Hooten, Illene Labrador, MD      . vitamin E capsule 400 Units  400 Units Oral Daily Hooten, Illene Labrador, MD         Discharge  Medications: Please see discharge summary for a list of discharge medications.  Relevant Imaging Results:  Relevant Lab Results:  Additional Information (SSN: [id number])  Kacin Dancy, Darleen Crocker, LCSW

## 2017-12-21 NOTE — Discharge Summary (Addendum)
Physician Discharge Summary  Patient ID: Sherry Morales MRN: [id number] DOB/AGE: 1930-11-07 84 y.o.  Admit date: 12/20/2017 Discharge date: 12/22/2017  Admission Diagnoses:  PRIMARY OSTEOARTHRITIS OF RIGHT HIP   Discharge Diagnoses: Patient Active Problem List   Diagnosis Date Noted  . Status post total replacement of hip 12/20/2017  . Primary osteoarthritis of right hip 03/26/2017  . CKD (chronic kidney disease), stage III (HCC) 12/20/2015  . Essential (primary) hypertension 06/04/2015  . Mild hyperlipidemia 06/04/2015  . Muscle spasms of head and/or neck 06/04/2015  . Neuropathy 06/04/2015  . Generalized OA 06/04/2015  . Tendinitis of wrist 06/04/2015    Past Medical History:  Diagnosis Date  . Anxiety   . Arthritis   . Chronic kidney disease    stage III  . Hypertension   . Lipidemia    mild  . Neuropathy      Transfusion: No transfusions during this admission   Consultants (if any):   Discharged Condition: Improved  Hospital Course: Sherry Morales is an 84 y.o. female who was admitted 12/20/2017 with a diagnosis of degenerative arthrosis right hip and went to the operating room on 12/20/2017 and underwent the above named procedures.    Surgeries:Procedure(s): TOTAL HIP ARTHROPLASTY on 12/20/2017  PRE-OPERATIVE DIAGNOSIS: Degenerative arthrosis of the right hip, primary  POST-OPERATIVE DIAGNOSIS:  Same  PROCEDURE:  Right total hip arthroplasty  SURGEON:  Jena Gauss. M.D.  ASSISTANT:  Van Clines, PA (present and scrubbed throughout the case, critical for assistance with exposure, retraction, instrumentation, and closure)  ANESTHESIA: spinal  ESTIMATED BLOOD LOSS: 250 mL  FLUIDS REPLACED: 950 mL of crystalloid  DRAINS: 2 medium drains to a Hemovac reservoir  IMPLANTS UTILIZED: DePuy 15 mm small stature AML femoral stem, 52 mm OD Pinnacle 100 acetabular component, +4 mm neutral Pinnacle Marathon polyethylene insert,  and a 36 mm M-SPEC +1.5 mm hip ball  INDICATIONS FOR SURGERY: Sherry Morales is a 84 y.o. year old female with a long history of progressive hip and groin  pain. X-rays demonstrated severe degenerative changes. The patient had not seen any significant improvement despite conservative nonsurgical intervention. After discussion of the risks and benefits of surgical intervention, the patient expressed understanding of the risks benefits and agree with plans for total hip arthroplasty.   The risks, benefits, and alternatives were discussed at length including but not limited to the risks of infection, bleeding, nerve injury, stiffness, blood clots, the need for revision surgery, limb length inequality, dislocation, cardiopulmonary complications, among others, and they were willing to proceed.   Patient tolerated the surgery well. No complications .Patient was taken to PACU where she was stabilized and then transferred to the orthopedic floor.  Patient started on Lovenox 30 mg q 12 hrs. Foot pumps applied bilaterally at 80 mm hgb. Heels elevated off bed with rolled towels. No evidence of DVT. Calves non tender. Negative Homan. Physical therapy started on day #1 for gait training and transfer with OT starting on  day #1 for ADL and assisted devices. Patient has done well with therapy. Ambulated 4 feet feet upon being discharged.  Patient's IV And Foley were discontinued on day #1 with Hemovac being discontinued on day #2. Dressing was changed on day 2 prior to patient being discharged   She was given perioperative antibiotics:  Anti-infectives (From admission, onward)   Start     Dose/Rate Route Frequency Ordered Stop   12/20/17 2200  ceFAZolin (ANCEF) IVPB 2g/100 mL premix  2 g 200 mL/hr over 30 Minutes Intravenous Every 6 hours 12/20/17 2116 12/21/17 2159   12/20/17 1339  ceFAZolin (ANCEF) 2-4 GM/100ML-% IVPB    Note to Pharmacy:  Renie Ora   : cabinet override      12/20/17  1339 12/21/17 0159   12/20/17 0600  ceFAZolin (ANCEF) IVPB 2g/100 mL premix  Status:  Discontinued     2 g 200 mL/hr over 30 Minutes Intravenous On call to O.R. 12/19/17 2303 12/20/17 1346    .  She was fitted with AV 1 compression foot pump devices, instructed on heel pumps, early ambulation, and fitted with TED stockings bilaterally for DVT prophylaxis.  She benefited maximally from the hospital stay and there were no complications.    Recent vital signs:  Vitals:   12/20/17 2351 12/21/17 0405  BP: (!) 121/53 125/62  Pulse: 85 80  Resp: 16 16  Temp: (!) 97.4 F (36.3 C) 97.9 F (36.6 C)  SpO2: 96% 94%    Recent laboratory studies:  Lab Results  Component Value Date   HGB 12.4 12/08/2017   HGB 12.2 11/25/2017   HGB 12.4 01/12/2017   Lab Results  Component Value Date   WBC 9.6 12/08/2017   PLT 296 12/08/2017   Lab Results  Component Value Date   INR 0.99 12/08/2017   Lab Results  Component Value Date   NA 139 12/08/2017   K 3.5 12/08/2017   CL 103 12/08/2017   CO2 27 12/08/2017   BUN 31 (H) 12/08/2017   CREATININE 1.11 (H) 12/08/2017   GLUCOSE 110 (H) 12/08/2017    DO NOT give oxycodone; lowers pt's BP and makes dizzy; rx wriiten before discharge was done.  Discharge Medications:   Allergies as of 12/21/2017      Reactions   Celecoxib Swelling   Pregabalin Other (See Comments)   Dizziness      Medication List    STOP taking these medications   MAGNESIUM PO   naproxen sodium 220 MG tablet Commonly known as:  ALEVE     TAKE these medications   Alpha-Lipoic Acid 600 MG Caps Take 600 mg by mouth 2 (two) times daily.   B COMPLEX 100 PO Take 1 tablet by mouth daily.   calcium carbonate 1500 (600 Ca) MG Tabs tablet Commonly known as:  OSCAL Take 1,500 mg by mouth daily.   Cinnamon 500 MG Tabs Take 1 tablet by mouth daily.   CoQ10 200 MG Caps Take 200 mg by mouth daily.   CREAM BASE EX Apply 1 application topically 3 (three) times daily as  needed (FOR PAIN.). HEMP CREAM INFUSED CREAM FOR PAIN.   enoxaparin 30 MG/0.3ML injection Commonly known as:  LOVENOX Inject 0.3 mLs (30 mg total) into the skin every 12 (twelve) hours. Start taking on:  12/23/2017   FISH OIL BURP-LESS 1000 MG Caps Take 1,000 mg by mouth 3 (three) times a week.   lisinopril-hydrochlorothiazide 20-25 MG tablet Commonly known as:  PRINZIDE,ZESTORETIC Take 1 tablet by mouth daily.   MSM 1000 MG Tabs Take 2,000 mg by mouth daily.   NON FORMULARY Take 1 Scoop by mouth daily. [place]ount[place] Beets- mix 1 scoop with 8 oz water   oxyCODONE 5 MG immediate release tablet Commonly known as:  Oxy IR/ROXICODONE Take 1 tablet (5 mg total) by mouth every 4 (four) hours as needed for moderate pain (pain score 4-6).   Potassium 99 MG Tabs Take 99 mg by mouth daily.   SALONPAS PAIN  RELIEF PATCH EX Place 1 patch onto the skin daily as needed (FOR PAIN.).   sodium chloride 0.65 % Soln nasal spray Commonly known as:  OCEAN Place 1 spray into both nostrils as needed for congestion.   traMADol 50 MG tablet Commonly known as:  ULTRAM Take 1-2 tablets (50-100 mg total) by mouth every 4 (four) hours as needed for moderate pain.   Turmeric Curcumin 500 MG Caps Take 500 mg by mouth daily.   Vitamin D3 5000 units Tabs Take 5,000 Units by mouth daily.   vitamin E 400 UNIT capsule Take 400 Units by mouth daily.            Durable Medical Equipment  (From admission, onward)        Start     Ordered   12/20/17 2117  DME Walker rolling  Once    Question:  Patient needs a walker to treat with the following condition  Answer:  S/P total hip arthroplasty   12/20/17 2116   12/20/17 2117  DME Bedside commode  Once    Question:  Patient needs a bedside commode to treat with the following condition  Answer:  S/P total hip arthroplasty   12/20/17 2116      Diagnostic Studies: US Carotid Duplex Bilateral  Result Date: 12/02/2017 CLINICAL DATA:  Mild hyperlipidemia  EXAM: BILATERAL CAROTID DUPLEX ULTRASOUND TECHNIQUE: Wallace Cullens scale imaging, color Doppler and duplex ultrasound were performed of bilateral carotid and vertebral arteries in the neck. COMPARISON:  None. FINDINGS: Criteria: Quantification of carotid stenosis is based on velocity parameters that correlate the residual internal carotid diameter with NASCET-based stenosis levels, using the diameter of the distal internal carotid lumen as the denominator for stenosis measurement. The following velocity measurements were obtained: RIGHT ICA:  98 cm/sec CCA:  110 cm/sec SYSTOLIC ICA/CCA RATIO:  0.9 DIASTOLIC ICA/CCA RATIO:  1.3 ECA:  95 cm/sec LEFT ICA:  87 cm/sec CCA:  90 cm/sec SYSTOLIC ICA/CCA RATIO:  1.0 DIASTOLIC ICA/CCA RATIO:  1.1 ECA:  84 cm/sec RIGHT CAROTID ARTERY: Little if any plaque in the bulb. Low resistance internal carotid Doppler pattern. RIGHT VERTEBRAL ARTERY:  Antegrade. LEFT CAROTID ARTERY: Mild calcified plaque in the bulb. Low resistance internal carotid Doppler pattern. LEFT VERTEBRAL ARTERY:  Antegrade. IMPRESSION: Less than 50% stenosis in the right and left internal carotid arteries. Electronically Signed   By: Jolaine Click M.D.   On: 12/02/2017 15:18   Dg Hip Port Unilat With Pelvis 1v Right  Result Date: 12/20/2017 CLINICAL DATA:  Post op films RT hip EXAM: DG HIP (WITH OR WITHOUT PELVIS) 1V PORT RIGHT COMPARISON:  None. FINDINGS: RIGHT hip arthroplasty hardware appears intact and appropriately positioned. Osseous alignment is anatomic. Expected postsurgical changes within the overlying soft tissues. Drainage catheters appear appropriately positioned at the RIGHT hip. IMPRESSION: Status post RIGHT hip arthroplasty. Hardware appears intact and appropriately positioned. No evidence of surgical complicating feature. Electronically Signed   By: Bary Richard M.D.   On: 12/20/2017 19:53    Disposition:   Discharge Instructions    Increase activity slowly   Complete by:  As directed        Follow-up Information    Hooten, Illene Labrador, MD On 02/01/2018.   Specialty:  Orthopedic Surgery Why:  at 1:30pm Contact information: [address] [facility] [place] [address] [phone]            Signed: Tera Partridge 12/21/2017, 7:57 AM

## 2017-12-21 NOTE — Progress Notes (Signed)
Patient's BP dropped working with PT this am. Patient was feeling dizzy and lightheaded. NO BP meds giving this am. IV fluids running at 100 cc/hr.  Notified MD. One time order for 500 cc/hr bolus.

## 2017-12-21 NOTE — Progress Notes (Signed)
   Subjective: 1 Day Post-Op Procedure(s) (LRB): TOTAL HIP ARTHROPLASTY (Right) Patient reports pain as mild.   Patient is well, and has had no acute complaints or problems We will start therapy today.  Plan is to go Rehab after hospital stay. no nausea and no vomiting Patient denies any chest pains or shortness of breath. Patient did well during the night.  No complaints.  Objective: Vital signs in last 24 hours: Temp:  [97 F (36.1 C)-98.3 F (36.8 C)] 97.9 F (36.6 C) (05/14 0405) Pulse Rate:  [54-86] 80 (05/14 0405) Resp:  [13-20] 16 (05/14 0405) BP: (121-191)/(53-91) 125/62 (05/14 0405) SpO2:  [92 %-100 %] 94 % (05/14 0405) Weight:  [92.5 kg (204 lb)] 92.5 kg (204 lb) (05/13 1359) well approximated incision Heels are non tender and elevated off the bed using rolled towels Intake/Output from previous day: 05/13 0701 - 05/14 0700 In: 2281.7 [I.V.:2281.7] Out: 1475 [Urine:955; Drains:270; Blood:250] Intake/Output this shift: No intake/output data recorded.  No results for input(s): HGB in the last 72 hours. No results for input(s): WBC, RBC, HCT, PLT in the last 72 hours. No results for input(s): NA, K, CL, CO2, BUN, CREATININE, GLUCOSE, CALCIUM in the last 72 hours. No results for input(s): LABPT, INR in the last 72 hours.  EXAM General - Patient is Alert, Appropriate and Oriented Extremity - Neurologically intact Neurovascular intact Sensation intact distally Intact pulses distally Dorsiflexion/Plantar flexion intact No cellulitis present Compartment soft Dressing - scant drainage Motor Function - intact, moving foot and toes well on exam.    Past Medical History:  Diagnosis Date  . Anxiety   . Arthritis   . Chronic kidney disease    stage III  . Hypertension   . Lipidemia    mild  . Neuropathy     Assessment/Plan: 1 Day Post-Op Procedure(s) (LRB): TOTAL HIP ARTHROPLASTY (Right) Active Problems:   Status post total replacement of hip  Estimated  body mass index is 33.95 kg/m as calculated from the following:   Height as of this encounter:  (1.651 m).   Weight as of this encounter: 92.5 kg (204 lb). Advance diet Up with therapy D/C IV fluids Plan for discharge tomorrow Discharge to SNF  Labs: None DVT Prophylaxis - Lovenox, Foot Pumps and TED hose Weight-Bearing as tolerated to right leg D/C O2 and Pulse OX and try on Room Air Begin working on bowel movement  Jon R. [facility] PA [facility] 12/21/2017, 7:48 AM

## 2017-12-21 NOTE — Progress Notes (Signed)
Patient did well with OT this afternoon, BPs stable without issues. Unable to wean off O2. Gave  Oxy after OT session. 1.5 hours later PT went to get patient up and she got dizzy, lightheaded, and BP dropped. Got patient back into chair and she started to feel better. Notified MD. Will hold off on giving Oxy for now.

## 2017-12-21 NOTE — Evaluation (Signed)
Physical Therapy Evaluation Patient Details Name: Sherry Morales MRN: [id number] DOB: 02-12-1931 Today's Date: 12/21/2017   History of Present Illness  Pt is an 84 yo F diagnosed with degenerative arthrosis of the right hip and is s/p elective R THA.  PMH includes: anxiety, CKC stage III, HTN, and neuropathy.      Clinical Impression  Pt presents with deficits in strength, transfers, mobility, gait, balance, and activity tolerance.  Pt required extensive assist with bed mobility tasks and min A with transfers per below along with cues for sequencing to maintain posterior hip precautions.  Pt was able to participate in some standing therex and then some minimal amb at the EOB before complaining of mild dizziness.  Pt returned to sitting at EOB and BP taken at 89/43 mmHg.  Pt returned to supine and nursing called.  With nursing in the room BP taken in supine at 90/41 mmHg but without symptoms.  Pt will benefit from PT services in a SNF setting upon discharge to safely address above deficits for decreased caregiver assistance and eventual return to PLOF.      Follow Up Recommendations SNF;Supervision for mobility/OOB    Equipment Recommendations  None recommended by PT    Recommendations for Other Services       Precautions / Restrictions Precautions Precautions: Posterior Hip;Fall Precaution Booklet Issued: Yes (comment) Precaution Comments: Hip precaution education provided to pt and family along with handout Restrictions Weight Bearing Restrictions: Yes RLE Weight Bearing: Weight bearing as tolerated      Mobility  Bed Mobility Overal bed mobility: Needs Assistance Bed Mobility: Sit to Supine;Supine to Sit     Supine to sit: Mod assist Sit to supine: Mod assist   General bed mobility comments: Mod verbal cues for sequencing along with Mod A for BLEs in/out of bed  Transfers Overall transfer level: Needs assistance Equipment used: Rolling walker (2  wheeled) Transfers: Sit to/from Stand Sit to Stand: Min assist         General transfer comment: Mod verbal and visual cues for sequencing to maintain hip precautions with Min A to prevent posterior LOB upon initial stand  Ambulation/Gait Ambulation/Gait assistance: Min guard Ambulation Distance (Feet): 2 Feet Assistive device: Rolling walker (2 wheeled) Gait Pattern/deviations: Step-to pattern;Decreased stance time - right;Decreased step length - left Gait velocity: Decreased   General Gait Details: Limited Amb at EOB with mod verbal cues for sequencing when pt began to c/o feeling mildly dizzy. Pt returned to sitting with BP taken at 89/43 mmHg, pt returned to supine and nursing notified.    Stairs            Wheelchair Mobility    Modified Rankin (Stroke Patients Only)       Balance Overall balance assessment: Needs assistance   Sitting balance-Leahy Scale: Good     Standing balance support: Bilateral upper extremity supported Standing balance-Leahy Scale: Poor Standing balance comment: Min posterior instability in standing                             Pertinent Vitals/Pain Pain Assessment: 0-10 Pain Score: 2  Pain Location: R hip Pain Descriptors / Indicators: Sore Pain Intervention(s): Premedicated before session;Monitored during session    Home Living Family/patient expects to be discharged to:: Private residence Living Arrangements: Alone Available Help at Discharge: Family;Available 24 hours/day Type of Home: House Home Access: Stairs to enter Entrance Stairs-Rails: None Entrance Stairs-Number of Steps: 1(1 step  x 2 to enter home with no rails for either step) Home Layout: One level Home Equipment: Cane - single point;Walker - 2 wheels;Walker - 4 wheels      Prior Function Level of Independence: Independent with assistive device(s)         Comments: Mod Ind amb limited community distances with a rollator, Ind with ADLs, no fall  history     Hand Dominance   Dominant Hand: Right    Extremity/Trunk Assessment   Upper Extremity Assessment Upper Extremity Assessment: Defer to OT evaluation    Lower Extremity Assessment Lower Extremity Assessment: Generalized weakness;RLE deficits/detail RLE Deficits / Details: R hip flex NT formally but <3/5 functionally       Communication   Communication: No difficulties  Cognition Arousal/Alertness: Awake/alert Behavior During Therapy: WFL for tasks assessed/performed Overall Cognitive Status: Within Functional Limits for tasks assessed                                        General Comments      Exercises Total Joint Exercises Ankle Circles/Pumps: Strengthening;Both;10 reps Quad Sets: Strengthening;Both;10 reps Gluteal Sets: Strengthening;Both;10 reps Hip ABduction/ADduction: AAROM;Right;5 reps Long Arc Quad: AROM;Both;10 reps Knee Flexion: AROM;Both;10 reps Marching in Standing: AROM;Both;5 reps Other Exercises Other Exercises: Posterior hip precaution education provided to pt and family verbally and during functional mobility Other Exercises: HEP education per handout with pt   Assessment/Plan    PT Assessment Patient needs continued PT services  PT Problem List Decreased strength;Decreased activity tolerance;Decreased balance;Decreased mobility;Decreased knowledge of use of DME       PT Treatment Interventions DME instruction;Gait training;Stair training;Functional mobility training;Balance training;Therapeutic exercise;Therapeutic activities;Patient/family education    PT Goals (Current goals can be found in the Care Plan section)  Acute Rehab PT Goals Patient Stated Goal: To walk better PT Goal Formulation: With patient Time For Goal Achievement: 01/03/18 Potential to Achieve Goals: Good    Frequency BID   Barriers to discharge Decreased caregiver support;Inaccessible home environment      Co-evaluation                AM-PAC PT "6 Clicks" Daily Activity  Outcome Measure Difficulty turning over in bed (including adjusting bedclothes, sheets and blankets)?: Unable Difficulty moving from lying on back to sitting on the side of the bed? : Unable Difficulty sitting down on and standing up from a chair with arms (e.g., wheelchair, bedside commode, etc,.)?: Unable Help needed moving to and from a bed to chair (including a wheelchair)?: A Lot Help needed walking in hospital room?: A Lot Help needed climbing 3-5 steps with a railing? : A Lot 6 Click Score: 9    End of Session Equipment Utilized During Treatment: Gait belt Activity Tolerance: Treatment limited secondary to medical complications (Comment)(Symptomatic decrease in BP) Patient left: in bed;with bed alarm set;with call bell/phone within reach;with SCD's reapplied;Other (comment)(Ice bag to R hip) Nurse Communication: Mobility status;Other (comment)(BP concerns) PT Visit Diagnosis: Other abnormalities of gait and mobility (R26.89);Muscle weakness (generalized) (M62.81)    Time: 1610-9604 PT Time Calculation (min) (ACUTE ONLY): 46 min   Charges:   PT Evaluation $PT Eval Low Complexity: 1 Low PT Treatments $Therapeutic Exercise: 8-22 mins $Therapeutic Activity: 8-22 mins   PT G Codes:        DElly Modena PT, DPT 12/21/17, 12:14 PM

## 2017-12-21 NOTE — Clinical Social Work Note (Signed)
Clinical Social Work Assessment  Patient Details  Name: Sherry Morales MRN: [id number] Date of Birth: 1931-02-09  Date of referral:  12/21/17               Reason for consult:  Facility Placement                Permission sought to share information with:  Chartered certified accountant granted to share information::  Yes, Verbal Permission Granted  Name::      [place]::   [place]  Relationship::     Contact Information:     Housing/Transportation Living arrangements for the past 2 months:  [place] of Information:  Patient, Other (Comment Required)(step-daughter Darla ) Patient Interpreter Needed:  None Criminal Activity/Legal Involvement Pertinent to Current Situation/Hospitalization:  No - Comment as needed Significant Relationships:  Other(Comment)(step-children. ) Lives with:  Self Do you feel safe going back to the place where you live?  Yes Need for family participation in patient care:  Yes (Comment)  Care giving concerns:  Patient lives in [place] alone.    Social Worker assessment / plan:  Holiday representative (CSW) received SNF consult. PT is recommending SNF. CSW met with patient and her step-daughter/ HPOA Darla [phone] was at bedside. Patient was alert and oriented X4 and was laying in the bed. CSW introduced self and explained role of CSW department. Patient reported that she lives alone in [place] and has no children of her own however she has 3 supportive step-children. Per patient her step-daughter Elgie Congo is her HPOA. CSW explained SNF process and that [facility] will have to approve it. Patient is agreeable to SNF search in [facility]. FL2 complete and faxed out.   CSW presented bed offers to patient and she chose [facility] CSW faxed H&P to [facility]. Per [facility] admissions coordinator at [facility] she will start [facility] SNF authorization today.    Employment status:   Retired Nurse, adult PT Recommendations:  Bear / Referral to community resources:  Eugenio Saenz  Patient/Family's Response to care:  Patient chose [facility]   Patient/Family's Understanding of and Emotional Response to Diagnosis, Current Treatment, and Prognosis:  Patient was very pleasant and thanked CSW for assistance.   Emotional Assessment Appearance:  Appears stated age Attitude/Demeanor/Rapport:    Affect (typically observed):  Accepting, Adaptable, Pleasant Orientation:  Oriented to Self, Oriented to Place, Oriented to  Time, Oriented to Situation Alcohol / Substance use:  Not Applicable Psych involvement (Current and /or in the community):  No (Comment)  Discharge Needs  Concerns to be addressed:  Discharge Planning Concerns Readmission within the last 30 days:  No Current discharge risk:  Dependent with Mobility Barriers to Discharge:  Continued Medical Work up   [employer], Veronia Beets, LCSW 12/21/2017, 1:43 PM

## 2017-12-21 NOTE — Anesthesia Postprocedure Evaluation (Signed)
Anesthesia Post Note  Patient: Sherry Morales  Procedure(s) Performed: TOTAL HIP ARTHROPLASTY (Right )  Patient location during evaluation: PACU Anesthesia Type: Spinal Level of consciousness: oriented and awake and alert Pain management: pain level controlled Vital Signs Assessment: post-procedure vital signs reviewed and stable Respiratory status: spontaneous breathing, respiratory function stable and patient connected to nasal cannula oxygen Cardiovascular status: blood pressure returned to baseline and stable Postop Assessment: no headache, no backache and no apparent nausea or vomiting Anesthetic complications: no     Last Vitals:  Vitals:   12/20/17 2223 12/20/17 2351  BP: 137/72 (!) 121/53  Pulse: (!) 54 85  Resp: 18 16  Temp: 36.4 C (!) 36.3 C  SpO2: 98% 96%    Last Pain:  Vitals:   12/20/17 2351  TempSrc: Oral  PainSc:                  Yevette Edwards

## 2017-12-21 NOTE — Evaluation (Signed)
Occupational Therapy Evaluation Patient Details Name: Sherry Morales MRN: [id number] DOB: November 27, 1930 Today's Date: 12/21/2017    History of Present Illness Pt is an 84 yo F diagnosed with degenerative arthrosis of the right hip and is s/p elective R THA.  PMH includes: anxiety, CKC stage III, HTN, and neuropathy.     Clinical Impression   Pt seen for OT evaluation this date, POD#1 from above surgery. Pt was independent in all ADLs prior to surgery, using a rollator for mobility due to hip pain. Pt is eager to return to PLOF with less pain and improved safety and independence. Pt currently requires mod assist for LB dressing and bathing while in seated position due to pain and limited AROM of R hip while maintaining precautions. Pt able to recall 1/3 posterior total hip precautions at start of session and unable to verbalize how to implement during ADL and mobility. Pt/dtr instructed extensively in posterior total hip precautions and how to implement, self care skills, falls prevention strategies, home/routines modifications, DME/AE for LB bathing and dressing tasks, and compression stocking mgt strategies. Pt required min assist for Sup>sit EOB for precautions and Min assist for transfers with mod-max verbal cues for sequencing to maintain precautions.  Pt/dtr educated in pursed lip breathing to maximize O2 sats (pt on 2L O2 at start of session 96%, decreasing to 88-89% on RA, able to improve to 94% with PLB on RA but unable to maintain, requiring 2L O2 reapplied). Pt would benefit from additional instruction in self care skills and techniques to help maintain precautions with or without assistive devices to support recall and carryover prior to discharge. Recommend HHOT upon discharge.      Follow Up Recommendations  Home health OT    Equipment Recommendations  3 in 1 bedside commode    Recommendations for Other Services       Precautions / Restrictions Precautions Precautions:  Posterior Hip;Fall Precaution Booklet Issued: No Precaution Comments: Extensive education and training provided, pt able to recall 1/3 at start of session, able to recall 3/3 with cues at end of session. Would benefit from additional training to maximize recall and carry over of hip precautions Restrictions Weight Bearing Restrictions: Yes RLE Weight Bearing: Weight bearing as tolerated      Mobility Bed Mobility Overal bed mobility: Needs Assistance Bed Mobility: Supine to Sit     Supine to sit: Min assist Sit to supine: Mod assist   General bed mobility comments: min assist to EOB and additional time/effort to perform, CGA for RLE to maximize adherence to posterior THPs  Transfers Overall transfer level: Needs assistance Equipment used: Rolling walker (2 wheeled) Transfers: Sit to/from Stand Sit to Stand: From elevated surface;Min assist         General transfer comment: mod VC for sequencing to maintain precautions    Balance Overall balance assessment: Needs assistance   Sitting balance-Leahy Scale: Good     Standing balance support: Bilateral upper extremity supported Standing balance-Leahy Scale: Poor Standing balance comment: Min posterior instability in standing                           ADL either performed or assessed with clinical judgement   ADL Overall ADL's : Needs assistance/impaired Eating/Feeding: Sitting;Independent   Grooming: Sitting;Independent   Upper Body Bathing: Sitting;Supervision/ safety;Set up   Lower Body Bathing: Sit to/from stand;Moderate assistance;With caregiver independent assisting   Upper Body Dressing : Sitting;Supervision/safety;Set up   Lower  Body Dressing: Sit to/from stand;Moderate assistance;With caregiver independent assisting Lower Body Dressing Details (indicate cue type and reason): pt/dtr educated in AE for LB dressing to maintain precautions; also educated in compression stocking mgt Toilet Transfer:  RW;BSC;Cueing for sequencing;Cueing for safety;Min guard;Ambulation Toilet Transfer Details (indicate cue type and reason): VC for sequencing for precautions         Functional mobility during ADLs: Min guard;Cueing for safety;Cueing for sequencing;Rolling walker       Vision Baseline Vision/History: Wears glasses Wears Glasses: At all times Patient Visual Report: No change from baseline       Perception     Praxis      Pertinent Vitals/Pain Pain Assessment: 0-10 Pain Score: 2  Pain Location: R hip at rest, increasing to 5/10 with mobility Pain Descriptors / Indicators: Sore Pain Intervention(s): Limited activity within patient's tolerance;Monitored during session;Repositioned     Hand Dominance Right   Extremity/Trunk Assessment Upper Extremity Assessment Upper Extremity Assessment: Overall WFL for tasks assessed(at least 4/5 bilaterally)   Lower Extremity Assessment Lower Extremity Assessment: Defer to PT evaluation;Generalized weakness;RLE deficits/detail RLE Deficits / Details: R hip flex NT formally but <3/5 functionally   Cervical / Trunk Assessment Cervical / Trunk Assessment: Normal   Communication Communication Communication: No difficulties   Cognition Arousal/Alertness: Awake/alert Behavior During Therapy: WFL for tasks assessed/performed Overall Cognitive Status: Within Functional Limits for tasks assessed                                     General Comments       Exercises Other Exercises Other Exercises: Pt/dtr educated in home/routines modifications, falls prevention strategies Other Exercises: Pt/dtr educated in pursed lip breathing to maximize O2 sats (pt on 2L O2 at start of session 96%, decreasing to 88-89% on RA, able to improve to 94% with PLB on RA but unable to maintain, requiring 2L O2 reapplied)   Shoulder Instructions      Home Living Family/patient expects to be discharged to:: Private residence Living  Arrangements: Alone Available Help at Discharge: Family;Available 24 hours/day Type of Home: House Home Access: Stairs to enter [employer] of Steps: 1 Entrance Stairs-Rails: None Home Layout: One level     Bathroom Shower/Tub: Producer, television/film/video: Handicapped height     Home Equipment: Cane - single point;Walker - 2 wheels;Walker - 4 wheels;Toilet riser;Shower seat;Adaptive equipment Adaptive Equipment: Reacher;Sock aid        Prior Functioning/Environment Level of Independence: Independent with assistive device(s)        Comments: Mod Ind amb limited community distances with a rollator, Ind with ADLs, no fall history, driving        OT Problem List: Decreased strength;Decreased knowledge of use of DME or AE;Decreased knowledge of precautions;Decreased activity tolerance;Cardiopulmonary status limiting activity;Pain;Impaired balance (sitting and/or standing)      OT Treatment/Interventions: Self-care/ADL training;Balance training;Therapeutic exercise;Therapeutic activities;DME and/or AE instruction;Patient/family education;Energy conservation    OT Goals(Current goals can be found in the care plan section) Acute Rehab OT Goals Patient Stated Goal: To walk better OT Goal Formulation: With patient/family Time For Goal Achievement: 01/04/18 Potential to Achieve Goals: Good ADL Goals Pt Will Perform Lower Body Dressing: with min assist;sit to/from stand;with adaptive equipment;with caregiver independent in assisting Pt Will Transfer to Toilet: with supervision;ambulating(elevated commode w/ rails, RW for amb) Additional ADL Goal #1: Pt will demo understanding of 3/3 posterior THPs with <10%  verbal cues to maximize safety.  OT Frequency: Min 1X/week   Barriers to D/C:            Co-evaluation              AM-PAC PT "6 Clicks" Daily Activity     Outcome Measure Help from another person eating meals?: None Help from another person taking  care of personal grooming?: None Help from another person toileting, which includes using toliet, bedpan, or urinal?: A Little Help from another person bathing (including washing, rinsing, drying)?: A Lot Help from another person to put on and taking off regular upper body clothing?: A Little Help from another person to put on and taking off regular lower body clothing?: A Lot 6 Click Score: 18   End of Session Equipment Utilized During Treatment: Gait belt;Rolling walker;Oxygen(2L O2)  Activity Tolerance: Patient tolerated treatment well Patient left: in chair;with call bell/phone within reach;with chair alarm set;with family/visitor present  OT Visit Diagnosis: Other abnormalities of gait and mobility (R26.89);Muscle weakness (generalized) (M62.81);Pain Pain - Right/Left: Right Pain - part of body: Hip                Time: 1610-9604 OT Time Calculation (min): 50 min Charges:  OT General Charges $OT Visit: 1 Visit OT Evaluation $OT Eval Moderate Complexity: 1 Mod OT Treatments $Self Care/Home Management : 38-52 mins  Richrd Prime, MPH, MS, OTR/L ascom [phone] 12/21/17, 3:53 PM

## 2017-12-21 NOTE — Clinical Social Work Placement (Signed)
   CLINICAL SOCIAL WORK PLACEMENT  NOTE  Date:  12/21/2017  Patient Details  Name: Sherry Morales MRN: [id number] Date of Birth: 1931/02/05  Clinical Social Work is seeking post-discharge placement for this patient at the Skilled  Nursing Facility level of care (*CSW will initial, date and re-position this form in  chart as items are completed):  Yes   Patient/family provided with [facility] Clinical Social Work Department's list of facilities offering this level of care within the geographic area requested by the patient (or if unable, by the patient's family).  Yes   Patient/family informed of their freedom to choose among providers that offer the needed level of care, that participate in Medicare, Medicaid or managed care program needed by the patient, have an available bed and are willing to accept the patient.  Yes   Patient/family informed of [facility]'s ownership interest in [facility] and [facility], as well as of the fact that they are under no obligation to receive care at these facilities.  PASRR submitted to EDS on 12/21/17     PASRR number received on 12/21/17     Existing PASRR number confirmed on       FL2 transmitted to all facilities in geographic area requested by pt/family on 12/21/17     FL2 transmitted to all facilities within larger geographic area on       Patient informed that his/her managed care company has contracts with or will negotiate with certain facilities, including the following:        Yes   Patient/family informed of bed offers received.  Patient chooses bed at [facility] )     Physician recommends and patient chooses bed at      Patient to be transferred to   on  .  Patient to be transferred to facility by       Patient family notified on   of transfer.  Name of family member notified:        PHYSICIAN       Additional Comment:    _______________________________________________ Calven Gilkes, Darleen Crocker,  LCSW 12/21/2017, 1:41 PM

## 2017-12-22 LAB — SURGICAL PATHOLOGY

## 2017-12-22 NOTE — Progress Notes (Signed)
Physical Therapy Treatment Patient Details Name: Sherry Morales MRN: [id number] DOB: December 02, 1930 Today's Date: 12/22/2017    History of Present Illness Pt is an 84 yo F diagnosed with degenerative arthrosis of the right hip and is s/p elective R THA.  PMH includes: anxiety, CKC stage III, HTN, and neuropathy.      PT Comments    Pt presents with deficits in strength, transfers, mobility, gait, balance, and activity tolerance.  Pt's baseline vital signs include BP 104/59 mmHg, HR 94 bpm, and SpO2 94% on 1.5LO2/min.  In standing BP 108/56 mmHg  with HR and SpO2 near baseline levels and with pt asymptomatic.  Pt able to amb 1 x 3' and 1 x 6' including training on safely performing 90 deg R turns to prevent CKC R hip IR but did fatige quickly requiring return to sitting.  After sitting and performing seated therex pt's BP taken again at 118/55 mmHg.  Pt will benefit from PT services in a SNF setting upon discharge to safely address above deficits for decreased caregiver assistance and eventual return to PLOF.     Follow Up Recommendations  SNF;Supervision for mobility/OOB     Equipment Recommendations  None recommended by PT    Recommendations for Other Services       Precautions / Restrictions Precautions Precautions: Posterior Hip;Fall Precaution Booklet Issued: Yes (comment) Precaution Comments: Posterior hip precaution education provided to pt and family  Restrictions Weight Bearing Restrictions: Yes RLE Weight Bearing: Weight bearing as tolerated    Mobility  Bed Mobility               General bed mobility comments: NT, pt in recliner  Transfers Overall transfer level: Needs assistance Equipment used: Rolling walker (2 wheeled) Transfers: Sit to/from Stand Sit to Stand: From elevated surface;Min guard         General transfer comment: Mod VC for sequencing to maintain precautions  Ambulation/Gait Ambulation/Gait assistance: Min guard Ambulation  Distance (Feet): 6 Feet Assistive device: Rolling walker (2 wheeled) Gait Pattern/deviations: Step-to pattern;Decreased stance time - right;Decreased step length - left Gait velocity: Decreased   General Gait Details: 90 deg R turn training to prevent CKC R hip IR   Stairs             Wheelchair Mobility    Modified Rankin (Stroke Patients Only)       Balance Overall balance assessment: Needs assistance   Sitting balance-Leahy Scale: Good     Standing balance support: Bilateral upper extremity supported Standing balance-Leahy Scale: Fair                              Cognition Arousal/Alertness: Awake/alert Behavior During Therapy: WFL for tasks assessed/performed Overall Cognitive Status: Within Functional Limits for tasks assessed                                        Exercises Total Joint Exercises Ankle Circles/Pumps: Strengthening;Both;10 reps;5 reps Quad Sets: Strengthening;Both;10 reps;5 reps Gluteal Sets: Strengthening;Both;10 reps Hip ABduction/ADduction: AROM;AAROM;10 reps(AAROM on the RLE) Long Arc Quad: AROM;Both;10 reps;15 reps Knee Flexion: AROM;Both;10 reps;15 reps Marching in Standing: AROM;Both;5 reps    General Comments        Pertinent Vitals/Pain Pain Assessment: 0-10 Pain Score: 3  Pain Location: R hip Pain Descriptors / Indicators: Sore;Aching Pain Intervention(s): Monitored during session;Premedicated before  session    Home Living                      Prior Function            PT Goals (current goals can now be found in the care plan section) Progress towards PT goals: Progressing toward goals    Frequency    BID      PT Plan Current plan remains appropriate    Co-evaluation              AM-PAC PT "6 Clicks" Daily Activity  Outcome Measure                   End of Session Equipment Utilized During Treatment: Gait belt Activity Tolerance: Patient tolerated  treatment well Patient left: with call bell/phone within reach;with SCD's reapplied;in chair;with chair alarm set;with family/visitor present;Other (comment)(Hip abd pillow set up in place) Nurse Communication: Mobility status;Other (comment) PT Visit Diagnosis: Other abnormalities of gait and mobility (R26.89);Muscle weakness (generalized) (M62.81)     Time: 1610-9604 PT Time Calculation (min) (ACUTE ONLY): 26 min  Charges:  $Gait Training: 8-22 mins $Therapeutic Exercise: 8-22 mins                    G Codes:       D. Elly Modena PT, DPT 12/22/17, 11:57 AM

## 2017-12-22 NOTE — Progress Notes (Signed)
   Subjective: 2 Days Post-Op Procedure(s) (LRB): TOTAL HIP ARTHROPLASTY (Right) Patient reports pain as moderate.   Patient was noted to have decreased blood pressure yesterday and dizziness after taking oxycodone and doing physical therapy.  Bolus was given which increased her blood pressure.  Oxycodone was stopped and she is tolerating the tramadol and Tylenol well as well as maintaining a stable blood pressure.  No dizziness this morning. Patient not able to do much yesterday with physical therapy.  Therapy recommended skilled nursing facility.  Patient has a bed offer at [facility] is to go Rehab after hospital stay. no nausea and no vomiting Patient denies any chest pains or shortness of breath. Objective: Vital signs in last 24 hours: Temp:  [97.5 F (36.4 C)-98.5 F (36.9 C)] 97.5 F (36.4 C) (05/14 2359) Pulse Rate:  [66-92] 92 (05/14 2359) Resp:  [16-18] 17 (05/14 2359) BP: (84-150)/(55-82) 150/65 (05/14 2359) SpO2:  [82 %-99 %] 94 % (05/15 0602) well approximated incision Heels are non tender and elevated off the bed using rolled towels Intake/Output from previous day: 05/14 0701 - 05/15 0700 In: 2045 [P.O.:600; I.V.:745; IV Piggyback:700] Out: 250 [Drains:250] Intake/Output this shift: No intake/output data recorded.  No results for input(s): HGB in the last 72 hours. No results for input(s): WBC, RBC, HCT, PLT in the last 72 hours. No results for input(s): NA, K, CL, CO2, BUN, CREATININE, GLUCOSE, CALCIUM in the last 72 hours. No results for input(s): LABPT, INR in the last 72 hours.  EXAM General - Patient is Alert, Appropriate and Oriented Extremity - Neurologically intact Neurovascular intact Sensation intact distally Intact pulses distally Dorsiflexion/Plantar flexion intact No cellulitis present Compartment soft Dressing - scant drainage Motor Function - intact, moving foot and toes well on exam.    Past Medical History:  Diagnosis Date  . Anxiety    . Arthritis   . Chronic kidney disease    stage III  . Hypertension   . Lipidemia    mild  . Neuropathy     Assessment/Plan: 2 Days Post-Op Procedure(s) (LRB): TOTAL HIP ARTHROPLASTY (Right) Active Problems:   Status post total replacement of hip  Estimated body mass index is 33.95 kg/m as calculated from the following:   Height as of this encounter:  (1.651 m).   Weight as of this encounter: 92.5 kg (204 lb). Up with therapy Discharge to SNF  Labs: None DVT Prophylaxis - Lovenox, Foot Pumps and TED hose Weight-Bearing as tolerated to right leg D/C O2 and Pulse OX and try on Room Air Please change dressing to right hip prior to patient being discharged Hold oxycodone  Logun Colavito R. [facility] 12/22/2017, 7:13 AM

## 2017-12-22 NOTE — Progress Notes (Signed)
Patient is being discharged to [facility]. Dressing to right hip changed. Daughter updated. Report called to [facility]. EMS notified. Waiting for transportation at this time.

## 2017-12-22 NOTE — Clinical Social Work Placement (Signed)
   CLINICAL SOCIAL WORK PLACEMENT  NOTE  Date:  12/22/2017  Patient Details  Name: Sherry Morales MRN: [id number] Date of Birth: 28-Mar-1931  Clinical Social Work is seeking post-discharge placement for this patient at the Skilled  Nursing Facility level of care (*CSW will initial, date and re-position this form in  chart as items are completed):  Yes   Patient/family provided with [facility] Clinical Social Work Department's list of facilities offering this level of care within the geographic area requested by the patient (or if unable, by the patient's family).  Yes   Patient/family informed of their freedom to choose among providers that offer the needed level of care, that participate in Medicare, Medicaid or managed care program needed by the patient, have an available bed and are willing to accept the patient.  Yes   Patient/family informed of [facility]'s ownership interest in [facility] and [facility] as well as of the fact that they are under no obligation to receive care at these facilities.  PASRR submitted to EDS on 12/21/17     PASRR number received on 12/21/17     Existing PASRR number confirmed on       FL2 transmitted to all facilities in geographic area requested by pt/family on 12/21/17     FL2 transmitted to all facilities within larger geographic area on       Patient informed that his/her managed care company has contracts with or will negotiate with certain facilities, including the following:        Yes   Patient/family informed of bed offers received.  Patient chooses bed at [facility] )     Physician recommends and patient chooses bed at      Patient to be transferred to [facility] ) on 12/22/17.  Patient to be transferred to facility by [facility] )     Patient family notified on 12/22/17 of transfer.  Name of family member notified:  (Patient's step-daughter Earlean Shawl is at bedside and aware of D/C today.   )      PHYSICIAN       Additional Comment:    _______________________________________________ Charlette Hennings, Darleen Crocker, LCSW 12/22/2017, 11:24 AM

## 2017-12-22 NOTE — Progress Notes (Signed)
Patient is medically stable for D/C to [facility] today. Per [facility] admissions coordinator at [facility] patient can come today to room 218. RN will call report at [phone] and arrange EMS for transport. Clinical Child psychotherapist (CSW) sent D/C orders to [employer] via [employer]. Patient is aware of above. Patient's step-daughter Earlean Shawl is at bedside and aware of above. Please reconsult if future social work needs arise. CSW signing off.   [employer], LCSW [phone]

## 2017-12-28 ENCOUNTER — Non-Acute Institutional Stay (SKILLED_NURSING_FACILITY): Payer: Medicare Other | Admitting: Gerontology

## 2017-12-28 ENCOUNTER — Encounter: Payer: Self-pay | Admitting: Gerontology

## 2017-12-28 DIAGNOSIS — R600 Localized edema: Secondary | ICD-10-CM

## 2017-12-28 DIAGNOSIS — Z96641 Presence of right artificial hip joint: Secondary | ICD-10-CM

## 2017-12-28 DIAGNOSIS — M1611 Unilateral primary osteoarthritis, right hip: Secondary | ICD-10-CM | POA: Diagnosis not present

## 2017-12-28 NOTE — Progress Notes (Signed)
Location:   [place] Room Number: [place] Service:  SNF [phone]) Provider:  Toni Arthurs, NP-C  Glean Hess, MD  Patient Care Team: Glean Hess, MD as PCP - General (Internal Medicine)  Extended Emergency Contact Information Primary Emergency Contact: Serina Cowper [place] Mobile Phone: [phone] Relation: Relative  Code Status:  DNR Goals of care: Advanced Directive information Advanced Directives 12/28/2017  Does Patient Have a Medical Advance Directive? Yes  Type of Advance Directive Out of facility DNR (pink MOST or yellow form);[place];Living will  Does patient want to make changes to medical advance directive? No - Patient declined  Copy of [place] in Chart? Yes     Chief Complaint  Patient presents with  . Acute Visit    Edema     HPI:  Pt is a 84 y.o. female seen today for an acute visit for rehab following hospitalization at [facility] for elective Right Total Hip Replacement for Osteoarthritis of the right hip. Pt has been participating in PT/OT. Pt has been progressing well. Pt reports her pain is very well controlled on current regimen. Pt reports she is typically only using tylenol for pain. Her appetite is good, voiding well and having regular BMs. Pt c/o Right leg edema and is concerned. RLE with generalized edema. B-pulses intact and equal. B- calves soft, supple. Negative homan's sign. No redness, no warmth. Incision well approximated with minimal serous drainage. No redness or warmth of the incision. No s/s of infection. Pt is afebrile. Pt remains on O2 2L [place]. No chest pains, no shortness of breath, no cough, no congestion. Not on Oxygen at home. Maintaining sats on RA while working with PT. Will wean off O2 as it is not needed. VSS. No other complaints.     Past Medical History:  Diagnosis Date  . Anxiety   . Arthritis   . Breast cyst   . Chronic kidney  disease    stage III  . Chronic kidney disease, stage III (moderate) (HCC) 12/24/2016   Overview: GFR = 51  . Chronic pain 06/04/2015  . Hypertension   . Lipidemia    mild  . Lumbar back pain 12/24/2016  . Neuropathy 06/04/2015  . Osteoarthritis   . Osteoporosis   . Shingles    Past Surgical History:  Procedure Laterality Date  . Breast biopsy Left   . CHOLECYSTECTOMY    . LAMINECTOMY    . TOTAL HIP ARTHROPLASTY Right 12/20/2017   Procedure: TOTAL HIP ARTHROPLASTY;  Surgeon: Dereck Leep, MD;  Location: [facility] ORS;  Service: Orthopedics;  Laterality: Right;  . TOTAL VAGINAL HYSTERECTOMY      Allergies  Allergen Reactions  . Celecoxib Swelling  . Pregabalin Other (See Comments)    Dizziness    Allergies as of 12/28/2017      Reactions   Celecoxib Swelling   Pregabalin Other (See Comments)   Dizziness      Medication List        Accurate as of 12/28/17  2:46 PM. Always use your most recent med list.          acetaminophen 325 MG tablet Commonly known as:  TYLENOL Take 650 mg by mouth every 4 (four) hours as needed.   Alpha-Lipoic Acid 600 MG Caps Take 600 mg by mouth 2 (two) times daily.   B COMPLEX 100 PO Take 0.4 mg by mouth daily.   calcium carbonate 1500 (  600 Ca) MG Tabs tablet Commonly known as:  OSCAL Take 1,500 mg by mouth daily.   Cinnamon 500 MG Tabs Take 1 tablet by mouth daily.   Coenzyme Q10 100 MG capsule Take 200 mg by mouth daily.   enoxaparin 30 MG/0.3ML injection Commonly known as:  LOVENOX Inject 0.3 mLs (30 mg total) into the skin every 12 (twelve) hours.   FISH OIL BURP-LESS 1000 MG Caps Take 1,000 mg by mouth 3 (three) times a week.   lisinopril-hydrochlorothiazide 20-25 MG tablet Commonly known as:  PRINZIDE,ZESTORETIC Take 1 tablet by mouth daily.   MSM 1000 MG Tabs Take 2,000 mg by mouth daily.   Potassium 99 MG Tabs Take 99 mg by mouth daily.   SALONPAS PAIN RELIEF PATCH EX Place 1 patch onto the skin daily as  needed (FOR PAIN.). apply to affected area   sodium chloride 0.65 % Soln nasal spray Commonly known as:  OCEAN Place 1 spray into both nostrils as needed for congestion.   traMADol 50 MG tablet Commonly known as:  ULTRAM Take 1-2 tablets (50-100 mg total) by mouth every 4 (four) hours as needed for moderate pain.   Vitamin D3 5000 units Tabs Take 5,000 Units by mouth daily.   vitamin E 400 UNIT capsule Take 400 Units by mouth daily.       Review of Systems  Constitutional: Negative for activity change, appetite change, chills, diaphoresis and fever.  HENT: Negative for congestion, mouth sores, nosebleeds, postnasal drip, sneezing, sore throat, trouble swallowing and voice change.   Respiratory: Negative for apnea, cough, choking, chest tightness, shortness of breath and wheezing.   Cardiovascular: Positive for leg swelling. Negative for chest pain and palpitations.  Gastrointestinal: Negative for abdominal distention, abdominal pain, constipation, diarrhea and nausea.  Genitourinary: Negative for difficulty urinating, dysuria, frequency and urgency.  Musculoskeletal: Positive for arthralgias (typical arthritis) and gait problem. Negative for back pain and myalgias.  Skin: Positive for wound. Negative for color change, pallor and rash.  Neurological: Negative for dizziness, tremors, syncope, speech difficulty, weakness, numbness and headaches.  Psychiatric/Behavioral: Negative for agitation and behavioral problems.  All other systems reviewed and are negative.   Immunization History  Administered Date(s) Administered  . Influenza, High Dose Seasonal PF 05/28/2017  . Influenza,inj,Quad PF,6+ Mos 06/10/2015, 06/03/2016  . Pneumococcal Conjugate-13 11/29/2014  . Pneumococcal Polysaccharide-23 04/05/1998   Pertinent  Health Maintenance Due  Topic Date Due  . DEXA SCAN  04/05/1998  . INFLUENZA VACCINE  03/10/2018  . MAMMOGRAM  04/01/2018  . PNA vac Low Risk Adult  Completed    Fall Risk  01/12/2017 06/10/2015  Falls in the past year? Yes No  Number falls in past yr: 1 -  Injury with Fall? No -  Comment Just bruised -  Follow up Falls evaluation completed -   Functional Status Survey:    Vitals:   12/28/17 1428  BP: (!) 132/56  Pulse: 87  Resp: 18  Temp: 98.3 F (36.8 C)  TempSrc: Oral  SpO2: 98%  Weight: 209 lb 8 oz (95 kg)  Height: _0  (1.651 m)   Body mass index is 34.86 kg/m. Physical Exam  Constitutional: She is oriented to person, place, and time. Vital signs are normal. She appears well-developed and well-nourished. She is active and cooperative. She does not appear ill. No distress. Nasal cannula in place.  HENT:  Head: Normocephalic and atraumatic.  Mouth/Throat: Uvula is midline, oropharynx is clear and moist and mucous membranes are normal. Mucous  membranes are not pale, not dry and not cyanotic.  Eyes: Pupils are equal, round, and reactive to light. Conjunctivae, EOM and lids are normal.  Neck: Trachea normal, normal range of motion and full passive range of motion without pain. Neck supple. No JVD present. No tracheal deviation, no edema and no erythema present. No thyromegaly present.  Cardiovascular: Normal rate, regular rhythm, normal heart sounds, intact distal pulses and normal pulses. Exam reveals no gallop, no distant heart sounds and no friction rub.  No murmur heard. Pulses:      Dorsalis pedis pulses are 2+ on the right side, and 2+ on the left side.  No edema  Pulmonary/Chest: Effort normal and breath sounds normal. No accessory muscle usage. No respiratory distress. She has no decreased breath sounds. She has no wheezes. She has no rhonchi. She has no rales. She exhibits no tenderness.  Abdominal: Soft. Normal appearance and bowel sounds are normal. She exhibits no distension and no ascites. There is no tenderness.  Musculoskeletal: She exhibits no edema or tenderness.       Right hip: She exhibits decreased range of  motion, decreased strength, swelling and laceration.  Expected osteoarthritis, stiffness; Bilateral Calves soft, supple. Negative Homan's Sign. B- pedal pulses equal  Neurological: She is alert and oriented to person, place, and time. She has normal strength.  Skin: Skin is warm and dry. Laceration noted. She is not diaphoretic. No cyanosis. No pallor. Nails show no clubbing.  Psychiatric: She has a normal mood and affect. Her speech is normal and behavior is normal. Judgment and thought content normal. Cognition and memory are normal.  Nursing note and vitals reviewed.   Labs reviewed: Recent Labs    07/14/17 0851 11/25/17 1025 12/08/17 1456  NA 140 140 139  K 4.6 4.3 3.5  CL 101 99 103  CO2 _0 GLUCOSE 80 103* 110*  BUN 33* 31* 31*  CREATININE 1.07* 1.19* 1.11*  CALCIUM 9.1 9.8 9.2   Recent Labs    01/12/17 0936 11/25/17 1025 12/08/17 1456  AST _1 ALT _2 ALKPHOS 102 115 101  BILITOT 0.4 0.3 0.4  PROT 7.0 7.0 7.5  ALBUMIN 4.7 4.8* 4.5   Recent Labs    01/12/17 0936 11/25/17 1025 12/08/17 1456  WBC 7.0 8.9 9.6  NEUTROABS 4.6 7.6*  --   HGB 12.4 12.2 12.4  HCT 37.7 33.9* 35.6  MCV 87 80 87.4  PLT 278 264 296   Lab Results  Component Value Date   TSH 3.940 11/25/2017   No results found for: HGBA1C Lab Results  Component Value Date   CHOL 234 (H) 01/12/2017   HDL 51 01/12/2017   LDLCALC 158 (H) 01/12/2017   TRIG 125 01/12/2017   CHOLHDL 4.6 (H) 01/12/2017    Significant Diagnostic Results in last 30 days:  US Carotid Duplex Bilateral  Result Date: 12/02/2017 CLINICAL DATA:  Mild hyperlipidemia EXAM: BILATERAL CAROTID DUPLEX ULTRASOUND TECHNIQUE: Pearline Cables scale imaging, color Doppler and duplex ultrasound were performed of bilateral carotid and vertebral arteries in the neck. COMPARISON:  None. FINDINGS: Criteria: Quantification of carotid stenosis is based on velocity parameters that correlate the residual internal carotid diameter with  NASCET-based stenosis levels, using the diameter of the distal internal carotid lumen as the denominator for stenosis measurement. The following velocity measurements were obtained: RIGHT ICA:  98 cm/sec CCA:  940 cm/sec SYSTOLIC ICA/CCA RATIO:  0.9 DIASTOLIC ICA/CCA RATIO:  1.3 ECA:  95 cm/sec  LEFT ICA:  87 cm/sec CCA:  90 cm/sec SYSTOLIC ICA/CCA RATIO:  1.0 DIASTOLIC ICA/CCA RATIO:  1.1 ECA:  84 cm/sec RIGHT CAROTID ARTERY: Little if any plaque in the bulb. Low resistance internal carotid Doppler pattern. RIGHT VERTEBRAL ARTERY:  Antegrade. LEFT CAROTID ARTERY: Mild calcified plaque in the bulb. Low resistance internal carotid Doppler pattern. LEFT VERTEBRAL ARTERY:  Antegrade. IMPRESSION: Less than 50% stenosis in the right and left internal carotid arteries. Electronically Signed   By: Marybelle Killings M.D.   On: 12/02/2017 15:18   Dg Hip Port Unilat With Pelvis 1v Right  Result Date: 12/20/2017 CLINICAL DATA:  Post op films RT hip EXAM: DG HIP (WITH OR WITHOUT PELVIS) 1V PORT RIGHT COMPARISON:  None. FINDINGS: RIGHT hip arthroplasty hardware appears intact and appropriately positioned. Osseous alignment is anatomic. Expected postsurgical changes within the overlying soft tissues. Drainage catheters appear appropriately positioned at the RIGHT hip. IMPRESSION: Status post RIGHT hip arthroplasty. Hardware appears intact and appropriately positioned. No evidence of surgical complicating feature. Electronically Signed   By: Franki Cabot M.D.   On: 12/20/2017 19:53    Assessment/Plan  Primary osteoarthritis of right hip  Status post total replacement of right hip  Localized edema   Continue PT/OT  Continue exercises as taught by PT/OT  Continue skin/incision care per protocol  Continue Tramadol 50 mg 1-2 tablets po Q 4 hours prn  Continue Tylenol 650 mg Q 4 hours prn pain  Continue Lovenox 30 mg SQ BID DVT prophylaxis  Continue TED Hose- thigh high daily  Elevate legs when at rest  Ice  pack QID and prn  Lasix 40 mg IM x 1  Extra dose Potassium Chloride 99 mg po x 1 with Lasix  Wean O2 to maintain sats >92%  Labs in a few days  Family/ staff Communication:  Total Time:  Documentation:  Face to Face:  Family/Phone:   Labs/tests ordered:  Cbc, met c  Medication list reviewed and assessed for continued appropriateness.  Vikki Ports, NP-C [facility] [phone], [place] [address] Cell Phone (Mon-Fri 8am-5pm):  [phone] On Call:  [phone] & follow prompts after 5pm & weekends Office Phone:  [phone] Office Fax:  [phone]

## 2017-12-31 ENCOUNTER — Other Ambulatory Visit
Admission: RE | Admit: 2017-12-31 | Discharge: 2017-12-31 | Disposition: A | Discharge status: Home / Self Care | Payer: Medicare Other | Source: Ambulatory Visit | Attending: Gerontology | Admitting: Gerontology

## 2017-12-31 DIAGNOSIS — Z471 Aftercare following joint replacement surgery: Secondary | ICD-10-CM | POA: Diagnosis present

## 2017-12-31 LAB — COMPREHENSIVE METABOLIC PANEL
ALK PHOS: 71 U/L (ref 38–126)
ALT: 23 U/L (ref 14–54)
AST: 35 U/L (ref 15–41)
Albumin: 2.9 g/dL — ABNORMAL LOW (ref 3.5–5.0)
Anion gap: 7 (ref 5–15)
BILIRUBIN TOTAL: 0.3 mg/dL (ref 0.3–1.2)
BUN: 28 mg/dL — AB (ref 6–20)
CALCIUM: 8.6 mg/dL — AB (ref 8.9–10.3)
CO2: 29 mmol/L (ref 22–32)
CREATININE: 1.04 mg/dL — AB (ref 0.44–1.00)
Chloride: 103 mmol/L (ref 101–111)
GFR calc Af Amer: 56 mL/min — ABNORMAL LOW (ref >60)
GFR calc non Af Amer: 48 mL/min — ABNORMAL LOW (ref >60)
Glucose, Bld: 100 mg/dL — ABNORMAL HIGH (ref 65–99)
Potassium: 3.8 mmol/L (ref 3.5–5.1)
Sodium: 139 mmol/L (ref 135–145)
TOTAL PROTEIN: 5.9 g/dL — AB (ref 6.5–8.1)

## 2017-12-31 LAB — CBC WITH DIFFERENTIAL/PLATELET
BASOS ABS: 0 10*3/uL (ref 0–0.1)
Basophils Relative: 0 %
Eosinophils Absolute: 0.2 10*3/uL (ref 0–0.7)
Eosinophils Relative: 4 %
HEMATOCRIT: 24.3 % — AB (ref 35.0–47.0)
HEMOGLOBIN: 8.3 g/dL — AB (ref 12.0–16.0)
Lymphocytes Relative: 19 %
Lymphs Abs: 1.3 10*3/uL (ref 1.0–3.6)
MCH: 29.4 pg (ref 26.0–34.0)
MCHC: 34 g/dL (ref 32.0–36.0)
MCV: 86.4 fL (ref 80.0–100.0)
MONOS PCT: 8 %
Monocytes Absolute: 0.6 10*3/uL (ref 0.2–0.9)
NEUTROS PCT: 69 %
Neutro Abs: 5 10*3/uL (ref 1.4–6.5)
Platelets: 357 10*3/uL (ref 150–440)
RBC: 2.82 MIL/uL — ABNORMAL LOW (ref 3.80–5.20)
RDW: 13.7 % (ref 11.5–14.5)
WBC: 7.2 10*3/uL (ref 3.6–11.0)

## 2018-01-03 ENCOUNTER — Other Ambulatory Visit
Admission: RE | Admit: 2018-01-03 | Discharge: 2018-01-03 | Disposition: A | Discharge status: Home / Self Care | Payer: Medicare Other | Source: Other Acute Inpatient Hospital | Attending: Gerontology | Admitting: Gerontology

## 2018-01-03 DIAGNOSIS — Z471 Aftercare following joint replacement surgery: Secondary | ICD-10-CM | POA: Insufficient documentation

## 2018-01-03 LAB — COMPREHENSIVE METABOLIC PANEL
ALT: 33 U/L (ref 14–54)
AST: 45 U/L — AB (ref 15–41)
Albumin: 3.3 g/dL — ABNORMAL LOW (ref 3.5–5.0)
Alkaline Phosphatase: 87 U/L (ref 38–126)
Anion gap: 7 (ref 5–15)
BUN: 29 mg/dL — AB (ref 6–20)
CHLORIDE: 102 mmol/L (ref 101–111)
CO2: 29 mmol/L (ref 22–32)
CREATININE: 0.99 mg/dL (ref 0.44–1.00)
Calcium: 8.8 mg/dL — ABNORMAL LOW (ref 8.9–10.3)
GFR calc Af Amer: 59 mL/min — ABNORMAL LOW (ref >60)
GFR, EST NON AFRICAN AMERICAN: 51 mL/min — AB (ref >60)
Glucose, Bld: 95 mg/dL (ref 65–99)
Potassium: 3.9 mmol/L (ref 3.5–5.1)
Sodium: 138 mmol/L (ref 135–145)
Total Bilirubin: 0.3 mg/dL (ref 0.3–1.2)
Total Protein: 6.8 g/dL (ref 6.5–8.1)

## 2018-01-03 LAB — CBC WITH DIFFERENTIAL/PLATELET
BASOS ABS: 0 10*3/uL (ref 0–0.1)
BASOS PCT: 0 %
EOS ABS: 0.3 10*3/uL (ref 0–0.7)
EOS PCT: 4 %
HEMATOCRIT: 26.3 % — AB (ref 35.0–47.0)
HEMOGLOBIN: 8.9 g/dL — AB (ref 12.0–16.0)
LYMPHS ABS: 1.8 10*3/uL (ref 1.0–3.6)
LYMPHS PCT: 22 %
MCH: 28.9 pg (ref 26.0–34.0)
MCHC: 33.7 g/dL (ref 32.0–36.0)
MCV: 85.8 fL (ref 80.0–100.0)
MONOS PCT: 7 %
Monocytes Absolute: 0.5 10*3/uL (ref 0.2–0.9)
NEUTROS ABS: 5.4 10*3/uL (ref 1.4–6.5)
Neutrophils Relative %: 67 %
PLATELETS: 412 10*3/uL (ref 150–440)
RBC: 3.07 MIL/uL — AB (ref 3.80–5.20)
RDW: 14 % (ref 11.5–14.5)
WBC: 8 10*3/uL (ref 3.6–11.0)

## 2018-01-08 ENCOUNTER — Encounter
Admission: RE | Admit: 2018-01-08 | Discharge: 2018-01-08 | Disposition: A | Discharge status: Home / Self Care | Payer: Medicare Other | Source: Ambulatory Visit | Attending: Internal Medicine | Admitting: Internal Medicine

## 2018-01-17 ENCOUNTER — Ambulatory Visit: Payer: Medicare Other

## 2018-01-25 ENCOUNTER — Ambulatory Visit (INDEPENDENT_AMBULATORY_CARE_PROVIDER_SITE_OTHER): Payer: Medicare Other | Admitting: Internal Medicine

## 2018-01-25 ENCOUNTER — Encounter: Payer: Self-pay | Admitting: Internal Medicine

## 2018-01-25 VITALS — BP 136/80 | HR 52 | Temp 98.0°F | Resp 16 | Ht 64.0 in | Wt 205.0 lb

## 2018-01-25 DIAGNOSIS — I10 Essential (primary) hypertension: Secondary | ICD-10-CM

## 2018-01-25 DIAGNOSIS — Z1231 Encounter for screening mammogram for malignant neoplasm of breast: Secondary | ICD-10-CM | POA: Diagnosis not present

## 2018-01-25 DIAGNOSIS — G629 Polyneuropathy, unspecified: Secondary | ICD-10-CM

## 2018-01-25 DIAGNOSIS — Z1239 Encounter for other screening for malignant neoplasm of breast: Secondary | ICD-10-CM

## 2018-01-25 DIAGNOSIS — E785 Hyperlipidemia, unspecified: Secondary | ICD-10-CM

## 2018-01-25 DIAGNOSIS — N183 Chronic kidney disease, stage 3 unspecified: Secondary | ICD-10-CM

## 2018-01-25 DIAGNOSIS — Z0001 Encounter for general adult medical examination with abnormal findings: Secondary | ICD-10-CM

## 2018-01-25 DIAGNOSIS — E2839 Other primary ovarian failure: Secondary | ICD-10-CM | POA: Diagnosis not present

## 2018-01-25 DIAGNOSIS — Z0000 Encounter for general adult medical examination without abnormal findings: Secondary | ICD-10-CM

## 2018-01-25 LAB — POCT URINALYSIS DIPSTICK
BILIRUBIN UA: NEGATIVE
GLUCOSE UA: NEGATIVE
Ketones, UA: NEGATIVE
LEUKOCYTES UA: NEGATIVE
Nitrite, UA: NEGATIVE
Protein, UA: NEGATIVE
RBC UA: NEGATIVE
SPEC GRAV UA: 1.02 (ref 1.010–1.025)
Urobilinogen, UA: 0.2 E.U./dL
pH, UA: 5 (ref 5.0–8.0)

## 2018-01-25 MED ORDER — LISINOPRIL-HYDROCHLOROTHIAZIDE 20-25 MG PO TABS: 1.0000 | ORAL_TABLET | Freq: Every day | ORAL | 3 refills | Status: DC

## 2018-01-25 MED ORDER — FUROSEMIDE 20 MG PO TABS: 10.0000 mg | ORAL_TABLET | Freq: Every day | ORAL | 0 refills | Status: DC | PRN

## 2018-01-25 NOTE — Progress Notes (Signed)
Date:  01/25/2018   Name:  Sherry Morales   DOB:  1930/08/24   MRN:  [id number]   Chief Complaint: Annual Exam Sherry Morales is a 84 y.o. female who presents today for her Complete Annual Exam. She feels fairly well. She reports exercising with physical therapy since hip surgery. She reports she is sleeping fairly well.  Mammogram was done 03/2017. She had decided for now not to have any more mammograms. She has aged out of colonoscopy.  She has never had a DEXA.  Hypertension  This is a chronic problem. The problem is controlled. Pertinent negatives include no chest pain, headaches, palpitations or shortness of breath. Past treatments include ACE inhibitors and diuretics. The current treatment provides significant improvement.   S/p Hip replacement - doing well, still using a walker.  PTx ends tomorrow.  No pain, taking only tylenol as needed.  Feels like she is progressing but eager to get back to driving. She is having more swelling in both feet R>L.  Neuropathy - having more sx now.  Taking only supplements - could not tolerate gabapentin or lyrica.  Taking MSM tablets.  Has used hemp lotion topically in the past with good results - ran out a while back and can't apply to lower legs right now anyway.  Review of Systems  Constitutional: Negative for chills, fatigue and fever.  HENT: Negative for congestion, hearing loss, tinnitus, trouble swallowing and voice change.   Eyes: Negative for visual disturbance.  Respiratory: Negative for cough, chest tightness, shortness of breath and wheezing.   Cardiovascular: Negative for chest pain, palpitations and leg swelling.  Gastrointestinal: Negative for abdominal pain, constipation, diarrhea and vomiting.  Endocrine: Negative for polydipsia and polyuria.  Genitourinary: Negative for dysuria, frequency, genital sores, vaginal bleeding and vaginal discharge.  Musculoskeletal: Positive for gait problem (walking with walker).  Negative for arthralgias and joint swelling.  Skin: Negative for color change and rash.  Neurological: Negative for dizziness, tremors, light-headedness and headaches.       Burning pain in feet and lower legs  Hematological: Negative for adenopathy. Does not bruise/bleed easily.  Psychiatric/Behavioral: Negative for dysphoric mood and sleep disturbance. The patient is not nervous/anxious.     Patient Active Problem List   Diagnosis Date Noted  . Status post total replacement of hip 12/20/2017  . Primary osteoarthritis of right hip 03/26/2017  . CKD (chronic kidney disease), stage III (HCC) 12/20/2015  . Essential (primary) hypertension 06/04/2015  . Mild hyperlipidemia 06/04/2015  . Muscle spasms of head and/or neck 06/04/2015  . Neuropathy 06/04/2015  . Generalized OA 06/04/2015  . Tendinitis of wrist 06/04/2015    Prior to Admission medications   Medication Sig Start Date End Date Taking? Authorizing Provider  acetaminophen (TYLENOL) 325 MG tablet Take 650 mg by mouth every 4 (four) hours as needed.   Yes [provider]  Alpha-Lipoic Acid 600 MG CAPS Take 600 mg by mouth 2 (two) times daily.   Yes [provider]  B Complex Vitamins (B COMPLEX 100 PO) Take 0.4 mg by mouth daily.    Yes [provider]  calcium carbonate (OSCAL) 1500 (600 Ca) MG TABS tablet Take 1,500 mg by mouth daily.   Yes [provider]  Cholecalciferol (VITAMIN D3) 5000 units TABS Take 5,000 Units by mouth daily.   Yes [provider]  Cinnamon 500 MG TABS Take 1 tablet by mouth daily.   Yes [provider]  Coenzyme Q10 100 MG  capsule Take 200 mg by mouth daily.   Yes [provider]  Liniments (SALONPAS PAIN RELIEF PATCH EX) Place 1 patch onto the skin daily as needed (FOR PAIN.). apply to affected area   Yes [provider]  lisinopril-hydrochlorothiazide (PRINZIDE,ZESTORETIC) 20-25 MG tablet Take 1 tablet by mouth daily. 01/12/17  Yes  Reubin MilanBerglund, Jimmie Rueter H, MD  Methylsulfonylmethane (MSM) 1000 MG TABS Take 2,000 mg by mouth daily.   Yes [provider]  Omega-3 Fatty Acids (FISH OIL BURP-LESS) 1000 MG CAPS Take 1,000 mg by mouth 3 (three) times a week.    Yes [provider]  Potassium 99 MG TABS Take 99 mg by mouth daily.   Yes [provider]  sodium chloride (OCEAN) 0.65 % SOLN nasal spray Place 1 spray into both nostrils as needed for congestion.   Yes [provider]  traMADol (ULTRAM) 50 MG tablet Take 1-2 tablets (50-100 mg total) by mouth every 4 (four) hours as needed for moderate pain. 12/21/17  Yes Tera PartridgeWolfe, Jon R, PA  vitamin E 400 UNIT capsule Take 400 Units by mouth daily.   Yes [provider]    Allergies  Allergen Reactions  . Celecoxib Swelling  . Pregabalin Other (See Comments)    Dizziness    Past Surgical History:  Procedure Laterality Date  . Breast biopsy Left   . CHOLECYSTECTOMY    . LAMINECTOMY    . TOTAL HIP ARTHROPLASTY Right 12/20/2017   Procedure: TOTAL HIP ARTHROPLASTY;  Surgeon: Donato HeinzHooten, James P, MD;  Location: [facility] ORS;  Service: Orthopedics;  Laterality: Right;  . TOTAL VAGINAL HYSTERECTOMY      Social History   Tobacco Use  . Smoking status: Never Smoker  . Smokeless tobacco: Never Used  Substance Use Topics  . Alcohol use: No    Alcohol/week: 0.0 oz  . Drug use: No     Medication list has been reviewed and updated.  Current Meds  Medication Sig  . acetaminophen (TYLENOL) 325 MG tablet Take 650 mg by mouth every 4 (four) hours as needed.  . Alpha-Lipoic Acid 600 MG CAPS Take 600 mg by mouth 2 (two) times daily.  . B Complex Vitamins (B COMPLEX 100 PO) Take 0.4 mg by mouth daily.   . calcium carbonate (OSCAL) 1500 (600 Ca) MG TABS tablet Take 1,500 mg by mouth daily.  . Cholecalciferol (VITAMIN D3) 5000 units TABS Take 5,000 Units by mouth daily.  . Cinnamon 500 MG TABS Take 1 tablet by mouth daily.  . Coenzyme Q10 100 MG capsule  Take 200 mg by mouth daily.  . Liniments (SALONPAS PAIN RELIEF PATCH EX) Place 1 patch onto the skin daily as needed (FOR PAIN.). apply to affected area  . lisinopril-hydrochlorothiazide (PRINZIDE,ZESTORETIC) 20-25 MG tablet Take 1 tablet by mouth daily.  . Methylsulfonylmethane (MSM) 1000 MG TABS Take 2,000 mg by mouth daily.  . Omega-3 Fatty Acids (FISH OIL BURP-LESS) 1000 MG CAPS Take 1,000 mg by mouth 3 (three) times a week.   . Potassium 99 MG TABS Take 99 mg by mouth daily.  . sodium chloride (OCEAN) 0.65 % SOLN nasal spray Place 1 spray into both nostrils as needed for congestion.  . traMADol (ULTRAM) 50 MG tablet Take 1-2 tablets (50-100 mg total) by mouth every 4 (four) hours as needed for moderate pain.  . vitamin E 400 UNIT capsule Take 400 Units by mouth daily.  . [DISCONTINUED] enoxaparin (LOVENOX) 30 MG/0.3ML injection Inject 0.3 mLs (30 mg total) into the  skin every 12 (twelve) hours.  . [DISCONTINUED] lisinopril-hydrochlorothiazide (PRINZIDE,ZESTORETIC) 20-25 MG tablet Take 1 tablet by mouth daily.    PHQ 2/9 Scores 01/25/2018 01/12/2017 12/19/2015 06/10/2015  PHQ - 2 Score 0 3 2 1   PHQ- 9 Score - 4 10 -    Physical Exam  Constitutional: She is oriented to person, place, and time. She appears well-developed and well-nourished. No distress.  HENT:  Head: Normocephalic and atraumatic.  Right Ear: Tympanic membrane and ear canal normal.  Left Ear: Tympanic membrane and ear canal normal.  Nose: Right sinus exhibits no maxillary sinus tenderness. Left sinus exhibits no maxillary sinus tenderness.  Mouth/Throat: Uvula is midline and oropharynx is clear and moist.  Eyes: Conjunctivae and EOM are normal. Right eye exhibits no discharge. Left eye exhibits no discharge. No scleral icterus.  Neck: Normal range of motion. Carotid bruit is not present. No erythema present. No thyromegaly present.  Cardiovascular: Normal rate, regular rhythm, normal heart sounds and normal pulses.    Pulmonary/Chest: Effort normal. No respiratory distress. She has no wheezes. Right breast exhibits no mass, no nipple discharge, no skin change and no tenderness. Left breast exhibits no mass, no nipple discharge, no skin change and no tenderness.  Abdominal: Soft. Bowel sounds are normal. There is no hepatosplenomegaly. There is no tenderness. There is no CVA tenderness.  Musculoskeletal: She exhibits edema. She exhibits no tenderness.  Lymphadenopathy:    She has no cervical adenopathy.    She has no axillary adenopathy.  Neurological: She is alert and oriented to person, place, and time. She has normal strength and normal reflexes. No cranial nerve deficit or sensory deficit. Coordination and gait normal.  Skin: Skin is warm, dry and intact. No rash noted. No erythema.  Psychiatric: She has a normal mood and affect. Her speech is normal and behavior is normal. Thought content normal.  Nursing note and vitals reviewed.   BP 136/80   Pulse (!) 52   Temp 98 F (36.7 C) (Oral)   Resp 16   Ht 5\' 4"  (1.626 m)   Wt 205 lb (93 kg)   SpO2 97%   BMI 35.19 kg/m   Assessment and Plan: 1. Annual physical exam Normal exam Aged out of colonoscopies Doing well s/p hip replacement  2. Breast cancer screening Pt declines further screening at this time Breast exam is benign  3. Essential (primary) hypertension controlled - lisinopril-hydrochlorothiazide (PRINZIDE,ZESTORETIC) 20-25 MG tablet; Take 1 tablet by mouth daily.  Dispense: 90 tablet; Refill: 3 - furosemide (LASIX) 20 MG tablet; Take 0.5-1 tablets (10-20 mg total) by mouth daily as needed.  Dispense: 15 tablet; Refill: 0  4. CKD (chronic kidney disease), stage III (HCC) Check labs Will give low dose lasix to take 1-2 times per week to reduce edema  5. Mild hyperlipidemia Continue healthy diet  6. Neuropathy Continue supplements Resume Hemp lotion  7. Ovarian failure - DG Bone Density; Future   Meds ordered this  encounter  Medications  . lisinopril-hydrochlorothiazide (PRINZIDE,ZESTORETIC) 20-25 MG tablet    Sig: Take 1 tablet by mouth daily.    Dispense:  90 tablet    Refill:  3  . furosemide (LASIX) 20 MG tablet    Sig: Take 0.5-1 tablets (10-20 mg total) by mouth daily as needed.    Dispense:  15 tablet    Refill:  0    Partially dictated using Animal nutritionist. Any errors are unintentional.  Bari Edward, MD [facility]  01/25/2018    

## 2018-04-13 ENCOUNTER — Other Ambulatory Visit: Payer: Self-pay

## 2018-04-13 DIAGNOSIS — I10 Essential (primary) hypertension: Secondary | ICD-10-CM

## 2018-04-13 MED ORDER — FUROSEMIDE 20 MG PO TABS: 10.0000 mg | ORAL_TABLET | Freq: Every day | ORAL | 0 refills | Status: DC | PRN

## 2018-05-18 ENCOUNTER — Encounter: Payer: Self-pay | Admitting: Internal Medicine

## 2018-05-23 ENCOUNTER — Ambulatory Visit (INDEPENDENT_AMBULATORY_CARE_PROVIDER_SITE_OTHER): Payer: Medicare Other

## 2018-05-23 DIAGNOSIS — Z23 Encounter for immunization: Secondary | ICD-10-CM | POA: Diagnosis not present

## 2018-07-22 ENCOUNTER — Encounter
Admission: RE | Admit: 2018-07-22 | Discharge: 2018-07-22 | Disposition: A | Discharge status: Home / Self Care | Payer: Medicare Other | Source: Ambulatory Visit | Attending: Internal Medicine | Admitting: Internal Medicine

## 2018-07-26 ENCOUNTER — Encounter: Payer: Self-pay | Admitting: Adult Health

## 2018-07-26 ENCOUNTER — Non-Acute Institutional Stay (SKILLED_NURSING_FACILITY): Payer: Medicare Other | Admitting: Adult Health

## 2018-07-26 DIAGNOSIS — M159 Polyosteoarthritis, unspecified: Secondary | ICD-10-CM

## 2018-07-26 DIAGNOSIS — H02402 Unspecified ptosis of left eyelid: Secondary | ICD-10-CM

## 2018-07-26 DIAGNOSIS — G8191 Hemiplegia, unspecified affecting right dominant side: Secondary | ICD-10-CM

## 2018-07-26 DIAGNOSIS — N183 Chronic kidney disease, stage 3 unspecified: Secondary | ICD-10-CM

## 2018-07-26 DIAGNOSIS — E782 Mixed hyperlipidemia: Secondary | ICD-10-CM | POA: Diagnosis not present

## 2018-07-26 DIAGNOSIS — D649 Anemia, unspecified: Secondary | ICD-10-CM

## 2018-07-26 DIAGNOSIS — I679 Cerebrovascular disease, unspecified: Secondary | ICD-10-CM | POA: Diagnosis not present

## 2018-07-26 DIAGNOSIS — M150 Primary generalized (osteo)arthritis: Secondary | ICD-10-CM

## 2018-07-26 DIAGNOSIS — H532 Diplopia: Secondary | ICD-10-CM

## 2018-07-26 DIAGNOSIS — I6322 Cerebral infarction due to unspecified occlusion or stenosis of basilar arteries: Secondary | ICD-10-CM

## 2018-07-26 MED ORDER — ATORVASTATIN CALCIUM 40 MG PO TABS
80.00 | ORAL_TABLET | ORAL | Status: DC
Start: 2018-07-26 — End: 2018-07-26

## 2018-07-26 MED ORDER — LIDOCAINE HCL (PF) 1 % IJ SOLN
0.50 | INTRAMUSCULAR | Status: DC
Start: ? — End: 2018-07-26

## 2018-07-26 MED ORDER — GENERIC EXTERNAL MEDICATION
6.00 | Status: DC
Start: 2018-07-25 — End: 2018-07-26

## 2018-07-26 MED ORDER — LIDOCAINE 5 % EX PTCH
1.00 | MEDICATED_PATCH | CUTANEOUS | Status: DC
Start: 2018-07-26 — End: 2018-07-26

## 2018-07-26 MED ORDER — LABETALOL HCL 5 MG/ML IV SOLN
5.00 | INTRAVENOUS | Status: DC
Start: ? — End: 2018-07-26

## 2018-07-26 MED ORDER — ASPIRIN 81 MG PO CHEW
81.00 | CHEWABLE_TABLET | ORAL | Status: DC
Start: 2018-07-26 — End: 2018-07-26

## 2018-07-26 MED ORDER — ACETAMINOPHEN 325 MG PO TABS
975.00 | ORAL_TABLET | ORAL | Status: DC
Start: ? — End: 2018-07-26

## 2018-07-26 MED ORDER — HYDRALAZINE HCL 20 MG/ML IJ SOLN
5.00 | INTRAMUSCULAR | Status: DC
Start: ? — End: 2018-07-26

## 2018-07-26 MED ORDER — SODIUM CHLORIDE FLUSH 0.9 % IV SOLN
5.00 | INTRAVENOUS | Status: DC
Start: 2018-07-25 — End: 2018-07-26

## 2018-07-26 MED ORDER — HYDRALAZINE HCL 20 MG/ML IJ SOLN
10.00 | INTRAMUSCULAR | Status: DC
Start: ? — End: 2018-07-26

## 2018-07-26 NOTE — Progress Notes (Signed)
Location:   [facility] Room Number: 208 A Place of Service:  SNF (31)   CODE STATUS: DNR  Allergies  Allergen Reactions  . Celecoxib Swelling  . Pregabalin Other (See Comments)    Dizziness    Chief Complaint  Patient presents with  . Hospitalization Follow-up    Hosptital Follow up    HPI:  She is a 84 year old woman who was hospitalized after fall with right side weakness and double vision. She had been living alone prior to her hospitalization. She has expressive aphasia. She was treated for basilar tip SCA and left PCA occlusion; she was deemed not a surgical candidate. The MRI showed punctate infarcts in the posterior circulation involving the bilateral occipital lobes; left pons and right cerebellum. Two days after this cva she was found to have with another MRI to have new infarcts in the left hippocampus; thalamus and left midbrain. She is here for short term rehab. At this time this admission represents a long term placement in either ALF or SNF. There are no reports of uncontrolled pain; no changes in her appetite without signs of aspiration; no signs of further neurological decline. She will continue to be followed for her chronic illnesses including: osteoarthritis; CKD stage 3; mixed hyperlipidemia   Past Medical History:  Diagnosis Date  . Anxiety   . Arthritis   . Breast cyst   . Chronic kidney disease    stage III  . Chronic kidney disease, stage III (moderate) (HCC) 12/24/2016   Overview: GFR = 51  . Chronic pain 06/04/2015  . Hypertension   . Lipidemia    mild  . Lumbar back pain 12/24/2016  . Neuropathy 06/04/2015  . Osteoarthritis   . Osteoporosis   . Shingles     Past Surgical History:  Procedure Laterality Date  . Breast biopsy Left   . CHOLECYSTECTOMY    . LAMINECTOMY    . TOTAL HIP ARTHROPLASTY Right 12/20/2017   Procedure: TOTAL HIP ARTHROPLASTY;  Surgeon: Donato Heinz, MD;  Location: [facility] ORS;  Service:  Orthopedics;  Laterality: Right;  . TOTAL VAGINAL HYSTERECTOMY      Social History   Socioeconomic History  . Marital status: Married    Spouse name: Not on file  . Number of children: 1  . Years of education: 66  . Highest education level: High school graduate  Occupational History  . Not on file  Social Needs  . Financial resource strain: Not on file  . Food insecurity:    Worry: Not on file    Inability: Not on file  . Transportation needs:    Medical: Not on file    Non-medical: Not on file  Tobacco Use  . Smoking status: Never Smoker  . Smokeless tobacco: Never Used  Substance and Sexual Activity  . Alcohol use: No    Alcohol/week: 0.0 standard drinks  . Drug use: No  . Sexual activity: Not Currently  Lifestyle  . Physical activity:    Days per week: Not on file    Minutes per session: Not on file  . Stress: Not on file  Relationships  . Social connections:    Talks on phone: Not on file    Gets together: Not on file    Attends religious service: Not on file    Active member of club or organization: Not on file    Attends meetings of clubs or organizations: Not on file    Relationship  status: Not on file  . Intimate partner violence:    Fear of current or ex partner: Not on file    Emotionally abused: Not on file    Physically abused: Not on file    Forced sexual activity: Not on file  Other Topics Concern  . Not on file  Social History Narrative  . Not on file   Family History  Problem Relation Age of Onset  . Diabetes type II Sister   . Heart disease Sister   . CAD Father   . Heart disease Father   . Diabetes type II Mother       VITAL SIGNS BP (!) 157/66   Pulse 85   Temp 98.4 F (36.9 C)   Resp 18   Ht 5\' 4"  (1.626 m)   Wt 205 lb 8 oz (93.2 kg)   SpO2 96%   BMI 35.27 kg/m   Outpatient Encounter Medications as of 07/26/2018  Medication Sig  . Alpha-Lipoic Acid 600 MG CAPS Take 600 mg by mouth daily.   Marland Kitchen atorvastatin (LIPITOR) 80  MG tablet Take 80 mg by mouth daily.  . B Complex Vitamins (B COMPLEX 100 PO) Take 1 tablet by mouth daily  . Calcium Carb-Cholecalciferol (CALCIUM/VITAMIN D) 500-200 MG-UNIT TABS Take 1 tablet by mouth 2 (two) times daily.  . Cholecalciferol (VITAMIN D3) 5000 units TABS Take 5,000 Units by mouth daily.  . Coenzyme Q10 100 MG capsule Take 100 mg by mouth daily.   Marland Kitchen lidocaine (LIDODERM) 5 % Apply 1 patch onto skin to the most painful area daily.  Remove & Discard patch within 12 hours or as directed by MD  . Liniments (SALONPAS PAIN RELIEF PATCH EX) Place 1 patch onto the skin daily as needed (FOR PAIN.). apply to affected area  . Magnesium Oxide 200 MG TABS Take 1 tablet by mouth daily.  . Methylsulfonylmethane (MSM) 1000 MG TABS Take 3,000 mg by mouth daily.   . NON FORMULARY Diet type:  NAS  . omega-3 acid ethyl esters (LOVAZA) 1 g capsule Take 1 g by mouth at bedtime.  . sodium chloride (OCEAN) 0.65 % SOLN nasal spray Place 1 spray into both nostrils as needed for congestion.  Marland Kitchen warfarin (COUMADIN) 6 MG tablet Take 6 mg by mouth at bedtime.  . [DISCONTINUED] acetaminophen (TYLENOL) 325 MG tablet Take 650 mg by mouth every 4 (four) hours as needed.  . [DISCONTINUED] calcium carbonate (OSCAL) 1500 (600 Ca) MG TABS tablet Take 1,500 mg by mouth daily.   No facility-administered encounter medications on file as of 07/26/2018.      SIGNIFICANT DIAGNOSTIC EXAMS  TODAY:   07-18-18: ct of head: Hypoattenuation of the left corona radiata is compatible with a remote lacunar infarction.  07-19-18: 2-d echo with bubble study: EF 55%;   07-20-18: left knee x-ray: Small left knee effusion without evidence of fracture.  07-20-18: MRI/MRA brain: 1. Multiple foci of ischemia within the left superior pons/cerebral peduncle, left thalamus and left medial temporal lobe have increased in extent from the recent prior MRI. No acute hemorrhage. 2. New occlusion of the left V3 and V4 segments. The  proximal extent of the left vertebral artery occlusion continues into the neck below the field of view on this examination.  3. Persistent occlusion of the left PCA and SCA unchanged from prior CT angiogram.   07-21-18: cervical spine x-ray: Mild degenerative changes of the cervical spine with no evidence of fracture or dynamic instability   07-25-18: MRI  brain MRA neck and brain:  IMPRESSION:  1.  New foci of acute infarction in the left superior cerebellum, left thalamus, left midbrain and left hippocampus. 2.  Evolving infarcts in the left midbrain, left occipital lobe and bilateral cerebellum. No acute hemorrhage or significant mass effect. 3.  Interval partial recanalization of the distal basilar artery and left PCA. 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  LABS REVIEWED; TODAY:  07-19-18: chol 232; ldl 157; trig 114; hdl 55 tsh 3.43 hgb a1c 6.0  07-25-18: wbc 8.5; hgb 7.8; hct 26.6; mcv 86; plt 233 glucose 108; bun 26; creat 1.0; k+ 3.9; na++ 142; ca 8.7 INR 2.0    Review of Systems  Reason unable to perform ROS: expressive aphasia    Physical Exam Constitutional:      General: She is not in acute distress.    Appearance: Normal appearance. She is well-developed. She is obese. She is not diaphoretic.  Eyes:     Comments: Left eye ptosis; is using a patch to right eye  Has diplopia   Neck:     Musculoskeletal: Neck supple.     Thyroid: No thyromegaly.  Cardiovascular:     Rate and Rhythm: Normal rate and regular rhythm.     Pulses: Normal pulses.     Heart sounds: Normal heart sounds.     Comments: Has heart monitor on  Pulmonary:     Effort: Pulmonary effort is normal. No respiratory distress.     Breath sounds: Normal breath sounds.  Abdominal:     General: Bowel sounds are normal. There is no distension.     Palpations: Abdomen is soft.     Tenderness: There is no abdominal tenderness.  Musculoskeletal:     Right lower leg: No edema.     Left  lower leg: No edema.     Comments: Is able to move all extremities Right hemiparesis Has slight weakness in right upper extremity Bilateral lower extremity weakness  History of right hip replacement in May 2019   Lymphadenopathy:     Cervical: No cervical adenopathy.  Skin:    General: Skin is warm and dry.     Comments: Has fading bruise to right eye   Neurological:     Mental Status: She is alert. She is disoriented.  Psychiatric:        Mood and Affect: Mood normal.       ASSESSMENT/ PLAN:  TODAY:   1. Essential hypertension: is stable: b/p 157/66: will monitor  2. Primary osteoarthritis involving multiple joints:is status post right hip (12/2017) is stable is taking MSM 3,000 mg daily uses a lidoderm and salon pas patches  3. CKD (chronic kidney disease) stage III: is stable bun 26; creat 1.0 will monitor  4. Mixed hyperlipidemia: is stable LDL 157; lipitor 80 mg daily and lovasa 1 gm daily   5. 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 Diplopia/left ptosis: is stable will continue patch to the right eye  7. Chronic anemia: is without change hgb 7.8   Will check cbc   MD is aware of resident's narcotic use and is in agreement with current plan of care. We will attempt to wean resident as apropriate   Synthia Innocenteborah Shandon Burlingame NP [facility]  Contact [phone] Monday through Friday 8am- 5pm  After hours call [phone]

## 2018-07-27 ENCOUNTER — Encounter: Payer: Self-pay | Admitting: Adult Health

## 2018-07-27 ENCOUNTER — Non-Acute Institutional Stay (SKILLED_NURSING_FACILITY): Payer: Medicare Other | Admitting: Adult Health

## 2018-07-27 ENCOUNTER — Other Ambulatory Visit
Admission: RE | Admit: 2018-07-27 | Discharge: 2018-07-27 | Disposition: A | Discharge status: Home / Self Care | Payer: Medicare Other | Source: Ambulatory Visit | Attending: Internal Medicine | Admitting: Internal Medicine

## 2018-07-27 DIAGNOSIS — I6322 Cerebral infarction due to unspecified occlusion or stenosis of basilar arteries: Secondary | ICD-10-CM

## 2018-07-27 DIAGNOSIS — I679 Cerebrovascular disease, unspecified: Secondary | ICD-10-CM | POA: Diagnosis not present

## 2018-07-27 DIAGNOSIS — R300 Dysuria: Secondary | ICD-10-CM | POA: Diagnosis present

## 2018-07-27 DIAGNOSIS — D649 Anemia, unspecified: Secondary | ICD-10-CM | POA: Insufficient documentation

## 2018-07-27 DIAGNOSIS — H532 Diplopia: Secondary | ICD-10-CM | POA: Insufficient documentation

## 2018-07-27 DIAGNOSIS — H02402 Unspecified ptosis of left eyelid: Secondary | ICD-10-CM | POA: Insufficient documentation

## 2018-07-27 DIAGNOSIS — Z7901 Long term (current) use of anticoagulants: Secondary | ICD-10-CM

## 2018-07-27 DIAGNOSIS — G8191 Hemiplegia, unspecified affecting right dominant side: Secondary | ICD-10-CM | POA: Diagnosis not present

## 2018-07-27 LAB — URINALYSIS, COMPLETE (UACMP) WITH MICROSCOPIC
BACTERIA UA: NONE SEEN
Bilirubin Urine: NEGATIVE
Glucose, UA: NEGATIVE mg/dL
Hgb urine dipstick: NEGATIVE
Ketones, ur: NEGATIVE mg/dL
Leukocytes, UA: NEGATIVE
Nitrite: NEGATIVE
PH: 5 (ref 5.0–8.0)
Protein, ur: NEGATIVE mg/dL
Specific Gravity, Urine: 1.02 (ref 1.005–1.030)

## 2018-07-27 LAB — PROTIME-INR
INR: 1.85
Prothrombin Time: 21.1 seconds — ABNORMAL HIGH (ref 11.4–15.2)

## 2018-07-27 NOTE — Progress Notes (Signed)
Location:   [facility] Room Number: 217 A Place of Service:  SNF (31)   CODE STATUS: DNR  Allergies  Allergen Reactions  . Celecoxib Swelling  . Pregabalin Other (See Comments)    Dizziness    Chief Complaint  Patient presents with  . Acute Visit    PT / INR Follow up    HPI:  She is on long term coumadin therapy for multiple CVA. There are no reports of missed doses. There are no signs of bleeding present. There are no reports of uncontrolled pain; no changes in appetite.   Past Medical History:  Diagnosis Date  . Anxiety   . Arthritis   . Breast cyst   . Chronic kidney disease    stage III  . Chronic kidney disease, stage III (moderate) (HCC) 12/24/2016   Overview: GFR = 51  . Chronic pain 06/04/2015  . Hypertension   . Lipidemia    mild  . Lumbar back pain 12/24/2016  . Neuropathy 06/04/2015  . Osteoarthritis   . Osteoporosis   . Shingles     Past Surgical History:  Procedure Laterality Date  . Breast biopsy Left   . CHOLECYSTECTOMY    . LAMINECTOMY    . TOTAL HIP ARTHROPLASTY Right 12/20/2017   Procedure: TOTAL HIP ARTHROPLASTY;  Surgeon: Donato Heinz, MD;  Location: [facility] ORS;  Service: Orthopedics;  Laterality: Right;  . TOTAL VAGINAL HYSTERECTOMY      Social History   Socioeconomic History  . Marital status: Married    Spouse name: Not on file  . Number of children: 1  . Years of education: 69  . Highest education level: High school graduate  Occupational History  . Not on file  Social Needs  . Financial resource strain: Not on file  . Food insecurity:    Worry: Not on file    Inability: Not on file  . Transportation needs:    Medical: Not on file    Non-medical: Not on file  Tobacco Use  . Smoking status: Never Smoker  . Smokeless tobacco: Never Used  Substance and Sexual Activity  . Alcohol use: No    Alcohol/week: 0.0 standard drinks  . Drug use: No  . Sexual activity: Not Currently  Lifestyle  .  Physical activity:    Days per week: Not on file    Minutes per session: Not on file  . Stress: Not on file  Relationships  . Social connections:    Talks on phone: Not on file    Gets together: Not on file    Attends religious service: Not on file    Active member of club or organization: Not on file    Attends meetings of clubs or organizations: Not on file    Relationship status: Not on file  . Intimate partner violence:    Fear of current or ex partner: Not on file    Emotionally abused: Not on file    Physically abused: Not on file    Forced sexual activity: Not on file  Other Topics Concern  . Not on file  Social History Narrative  . Not on file   Family History  Problem Relation Age of Onset  . Diabetes type II Sister   . Heart disease Sister   . CAD Father   . Heart disease Father   . Diabetes type II Mother       VITAL SIGNS BP (!) 142/87   Pulse  98   Temp 99.4 F (37.4 C)   Resp 18   Ht 5\' 4"  (1.626 m)   Wt 205 lb 8 oz (93.2 kg)   SpO2 97%   BMI 35.27 kg/m   Outpatient Encounter Medications as of 07/27/2018  Medication Sig  . Alpha-Lipoic Acid 600 MG CAPS Take 600 mg by mouth daily.   Marland Kitchen. aspirin 81 MG chewable tablet Chew 81 mg by mouth daily.  Marland Kitchen. atorvastatin (LIPITOR) 80 MG tablet Take 80 mg by mouth daily.  . B Complex Vitamins (B COMPLEX 100 PO) Take 1 tablet by mouth daily  . Calcium Carb-Cholecalciferol (CALCIUM/VITAMIN D) 500-200 MG-UNIT TABS Take 1 tablet by mouth 2 (two) times daily.  . Cholecalciferol (VITAMIN D3) 5000 units TABS Take 5,000 Units by mouth daily.  . Coenzyme Q10 100 MG capsule Take 100 mg by mouth daily.   Marland Kitchen. lidocaine (LIDODERM) 5 % Apply 1 patch onto skin to the most painful area daily.  Remove & Discard patch within 12 hours or as directed by MD  . Liniments (SALONPAS PAIN RELIEF PATCH EX) Place 1 patch onto the skin daily as needed (FOR PAIN.). apply to affected area  . Magnesium Oxide 200 MG TABS Take 1 tablet by mouth  daily.  . Methylsulfonylmethane (MSM) 1000 MG TABS Take 3,000 mg by mouth daily.   . NON FORMULARY Diet type:  NAS  . omega-3 acid ethyl esters (LOVAZA) 1 g capsule Take 1 g by mouth at bedtime.  . sodium chloride (OCEAN) 0.65 % SOLN nasal spray Place 1 spray into both nostrils daily as needed for congestion.   Marland Kitchen. warfarin (COUMADIN) 6 MG tablet Take 6 mg by mouth at bedtime.   No facility-administered encounter medications on file as of 07/27/2018.      SIGNIFICANT DIAGNOSTIC EXAMS   PREVIOUS:   07-18-18: ct of head: Hypoattenuation of the left corona radiata is compatible with a remote lacunar infarction.  07-19-18: 2-d echo with bubble study: EF 55%;   07-20-18: left knee x-ray: Small left knee effusion without evidence of fracture.  07-20-18: MRI/MRA brain: 1. Multiple foci of ischemia within the left superior pons/cerebral peduncle, left thalamus and left medial temporal lobe have increased in extent from the recent prior MRI. No acute hemorrhage. 2. New occlusion of the left V3 and V4 segments. The proximal extent of the left vertebral artery occlusion continues into the neck below the field of view on this examination.  3. Persistent occlusion of the left PCA and SCA unchanged from prior CT angiogram.   07-21-18: cervical spine x-ray: Mild degenerative changes of the cervical spine with no evidence of fracture or dynamic instability   07-25-18: MRI brain MRA neck and brain:  IMPRESSION:  1.  New foci of acute infarction in the left superior cerebellum, left thalamus, left midbrain and left hippocampus. 2.  Evolving infarcts in the left midbrain, left occipital lobe and bilateral cerebellum. No acute hemorrhage or significant mass effect. 3.  Interval partial recanalization of the distal basilar artery and left PCA. 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  NO NEW EXAMS  LABS REVIEWED; PREVIOUS:  07-19-18: chol 232; ldl 157; trig 114; hdl 55 tsh 3.43  hgb a1c 6.0  07-25-18: wbc 8.5; hgb 7.8; hct 26.6; mcv 86; plt 233 glucose 108; bun 26; creat 1.0; k+ 3.9; na++ 142; ca 8.7 INR 2.0   TODAY:   07-27-18: INR 1.85 on coumadin 6 mg    Review of Systems  Reason unable to  perform ROS: expressive aphasi      Physical Exam Constitutional:      General: She is not in acute distress.    Appearance: Normal appearance. She is well-developed. She is not diaphoretic.     Comments: Obese   Eyes:     Comments: Left eye ptosis; is using a patch to right eye  Has diplopia    Neck:     Thyroid: No thyromegaly.  Cardiovascular:     Rate and Rhythm: Normal rate and regular rhythm.     Heart sounds: Normal heart sounds.     Comments: Has heart monitor on  Pulmonary:     Effort: Pulmonary effort is normal. No respiratory distress.     Breath sounds: Normal breath sounds.  Abdominal:     General: Bowel sounds are normal. There is no distension.     Palpations: Abdomen is soft.     Tenderness: There is no abdominal tenderness.  Musculoskeletal:     Right lower leg: No edema.     Left lower leg: No edema.     Comments: Is able to move all extremities Right hemiparesis Has slight weakness in right upper extremity Bilateral lower extremity weakness  History of right hip replacement in May 2019    Lymphadenopathy:     Cervical: No cervical adenopathy.  Skin:    General: Skin is warm and dry.  Neurological:     Mental Status: She is alert. Mental status is at baseline.  Psychiatric:        Mood and Affect: Mood normal.      ASSESSMENT/ PLAN:  TODAY:   1. 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 Current use of long term anticoagulation  For INR 1.85 will begin coumadin 7 mg daily and will check INR on 08-01-18.          MD is aware of resident's narcotic use and is in agreement with current plan of care. We will attempt to wean resident  as apropriate   Synthia Innocent NP [facility]  Contact [phone] Monday through Friday 8am- 5pm  After hours call [phone]

## 2018-07-28 LAB — URINE CULTURE: Culture: NO GROWTH

## 2018-07-31 DIAGNOSIS — Z7901 Long term (current) use of anticoagulants: Secondary | ICD-10-CM | POA: Insufficient documentation

## 2018-08-01 ENCOUNTER — Other Ambulatory Visit
Admission: RE | Admit: 2018-08-01 | Discharge: 2018-08-01 | Disposition: A | Discharge status: Home / Self Care | Payer: Medicare Other | Source: Ambulatory Visit | Attending: Adult Health | Admitting: Adult Health

## 2018-08-01 ENCOUNTER — Non-Acute Institutional Stay (SKILLED_NURSING_FACILITY): Payer: Medicare Other | Admitting: Adult Health

## 2018-08-01 ENCOUNTER — Encounter: Payer: Self-pay | Admitting: Adult Health

## 2018-08-01 DIAGNOSIS — D649 Anemia, unspecified: Secondary | ICD-10-CM | POA: Diagnosis present

## 2018-08-01 DIAGNOSIS — J208 Acute bronchitis due to other specified organisms: Secondary | ICD-10-CM | POA: Diagnosis not present

## 2018-08-01 DIAGNOSIS — I6322 Cerebral infarction due to unspecified occlusion or stenosis of basilar arteries: Secondary | ICD-10-CM | POA: Diagnosis not present

## 2018-08-01 DIAGNOSIS — Z7901 Long term (current) use of anticoagulants: Secondary | ICD-10-CM | POA: Diagnosis not present

## 2018-08-01 DIAGNOSIS — I679 Cerebrovascular disease, unspecified: Secondary | ICD-10-CM | POA: Diagnosis not present

## 2018-08-01 DIAGNOSIS — G8191 Hemiplegia, unspecified affecting right dominant side: Secondary | ICD-10-CM

## 2018-08-01 LAB — CBC WITH DIFFERENTIAL/PLATELET
Abs Immature Granulocytes: 0.1 10*3/uL — ABNORMAL HIGH (ref 0.00–0.07)
Basophils Absolute: 0.1 10*3/uL (ref 0.0–0.1)
Basophils Relative: 0 %
EOS PCT: 3 %
Eosinophils Absolute: 0.4 10*3/uL (ref 0.0–0.5)
HCT: 24 % — ABNORMAL LOW (ref 36.0–46.0)
Hemoglobin: 7.4 g/dL — ABNORMAL LOW (ref 12.0–15.0)
Immature Granulocytes: 1 %
Lymphocytes Relative: 9 %
Lymphs Abs: 1.3 10*3/uL (ref 0.7–4.0)
MCH: 25.9 pg — AB (ref 26.0–34.0)
MCHC: 30.8 g/dL (ref 30.0–36.0)
MCV: 83.9 fL (ref 80.0–100.0)
Monocytes Absolute: 1.1 10*3/uL — ABNORMAL HIGH (ref 0.1–1.0)
Monocytes Relative: 8 %
Neutro Abs: 11.1 10*3/uL — ABNORMAL HIGH (ref 1.7–7.7)
Neutrophils Relative %: 79 %
Platelets: 329 10*3/uL (ref 150–400)
RBC: 2.86 MIL/uL — ABNORMAL LOW (ref 3.87–5.11)
RDW: 15 % (ref 11.5–15.5)
WBC: 14 10*3/uL — ABNORMAL HIGH (ref 4.0–10.5)
nRBC: 0 % (ref 0.0–0.2)

## 2018-08-01 LAB — PROTIME-INR
INR: 5.74
Prothrombin Time: 50.7 seconds — ABNORMAL HIGH (ref 11.4–15.2)

## 2018-08-01 NOTE — Progress Notes (Signed)
Location:   [facility] Room Number: 217A Place of Service:  SNF (31)   CODE STATUS: DNR  Allergies  Allergen Reactions  . Celecoxib Swelling  . Pregabalin Other (See Comments)    Dizziness    Chief Complaint  Patient presents with  . Acute Visit    Care Plan & INR check  . Medical Management of Chronic Issues weekly follow up for the first 30 days post hospitalization.      HPI:  We have come together for her routine care plan meeting she does have family present. She does have a cough with Annalissa Murphey sputum production. Her diplopia is slowly improving; OT is working with her eyes to improve function. Her right leg is weak; right arm weak with poor fine motor function.  She continues to have difficulty finding her words. ST states that she is coughing with liquids and is educating her on chin tuck method. Her goal remains to go home. She will need at a wheelchair. Her INR is 5.74. she continues to be monitored for her chronic illnesses including: cva; hemiparesis; acute bronchitis   Past Medical History:  Diagnosis Date  . Anxiety   . Arthritis   . Breast cyst   . Chronic kidney disease    stage III  . Chronic kidney disease, stage III (moderate) (HCC) 12/24/2016   Overview: GFR = 51  . Chronic pain 06/04/2015  . Hypertension   . Lipidemia    mild  . Lumbar back pain 12/24/2016  . Neuropathy 06/04/2015  . Osteoarthritis   . Osteoporosis   . Shingles     Past Surgical History:  Procedure Laterality Date  . Breast biopsy Left   . CHOLECYSTECTOMY    . LAMINECTOMY    . TOTAL HIP ARTHROPLASTY Right 12/20/2017   Procedure: TOTAL HIP ARTHROPLASTY;  Surgeon: Donato Heinz, MD;  Location: [facility] ORS;  Service: Orthopedics;  Laterality: Right;  . TOTAL VAGINAL HYSTERECTOMY      Social History   Socioeconomic History  . Marital status: Married    Spouse name: Not on file  . Number of children: 1  . Years of education: 49  . Highest education  level: High school graduate  Occupational History  . Not on file  Social Needs  . Financial resource strain: Not on file  . Food insecurity:    Worry: Not on file    Inability: Not on file  . Transportation needs:    Medical: Not on file    Non-medical: Not on file  Tobacco Use  . Smoking status: Never Smoker  . Smokeless tobacco: Never Used  Substance and Sexual Activity  . Alcohol use: No    Alcohol/week: 0.0 standard drinks  . Drug use: No  . Sexual activity: Not Currently  Lifestyle  . Physical activity:    Days per week: Not on file    Minutes per session: Not on file  . Stress: Not on file  Relationships  . Social connections:    Talks on phone: Not on file    Gets together: Not on file    Attends religious service: Not on file    Active member of club or organization: Not on file    Attends meetings of clubs or organizations: Not on file    Relationship status: Not on file  . Intimate partner violence:    Fear of current or ex partner: Not on file    Emotionally abused: Not on file  Physically abused: Not on file    Forced sexual activity: Not on file  Other Topics Concern  . Not on file  Social History Narrative  . Not on file   Family History  Problem Relation Age of Onset  . Diabetes type II Sister   . Heart disease Sister   . CAD Father   . Heart disease Father   . Diabetes type II Mother       VITAL SIGNS BP (!) 156/65   Pulse 91   Temp 98.8 F (37.1 C) (Oral)   Resp 18   Ht 5\' 4"  (1.626 m)   Wt 205 lb 8 oz (93.2 kg)   SpO2 93%   BMI 35.27 kg/m   Outpatient Encounter Medications as of 08/01/2018  Medication Sig  . acetaminophen (TYLENOL) 325 MG tablet Take 650 mg by mouth every 4 (four) hours as needed. for pain/ increased temp. May be administered orally, per G-tube if needed or rectally if unable to swallow (separate order). Maximum dose for 24 hours is 3,000 mg from all sources of Acetaminophen/ Tylenol  . albuterol (PROVENTIL  HFA;VENTOLIN HFA) 108 (90 Base) MCG/ACT inhaler Inhale 1 puff into the lungs every 6 (six) hours.  . Alpha-Lipoic Acid 600 MG CAPS Take 600 mg by mouth daily.   Marland Kitchen amoxicillin-clavulanate (AUGMENTIN) 875-125 MG tablet Take 1 tablet by mouth 2 (two) times daily.  Marland Kitchen aspirin 81 MG chewable tablet Chew 81 mg by mouth daily.  Marland Kitchen atorvastatin (LIPITOR) 80 MG tablet Take 80 mg by mouth daily.  . B Complex Vitamins (B COMPLEX 100 PO) Take 1 tablet by mouth daily  . Calcium Carb-Cholecalciferol (CALCIUM/VITAMIN D) 500-200 MG-UNIT TABS Take 1 tablet by mouth 2 (two) times daily.  . Cholecalciferol (VITAMIN D3) 5000 units TABS Take 5,000 Units by mouth daily.  . Coenzyme Q10 100 MG capsule Take 100 mg by mouth daily.   Marland Kitchen dextromethorphan-guaiFENesin (MUCINEX DM) 30-600 MG 12hr tablet Take 1 tablet by mouth 2 (two) times daily.  Marland Kitchen lidocaine (LIDODERM) 5 % Apply 1 patch onto skin to the most painful area daily.  Remove & Discard patch within 12 hours or as directed by MD  . Liniments (SALONPAS PAIN RELIEF PATCH EX) Place 1 patch onto the skin daily as needed (FOR PAIN.). apply to affected area  . Magnesium Oxide 200 MG TABS Take 1 tablet by mouth daily.  . Methylsulfonylmethane (MSM) 1000 MG TABS Take 3,000 mg by mouth daily.   . NON FORMULARY Diet type:  NAS  . omega-3 acid ethyl esters (LOVAZA) 1 g capsule Take 1 g by mouth at bedtime.  . Probiotic Product (RISA-BID PROBIOTIC PO) Take 1 tablet by mouth 2 (two) times daily.  . sertraline (ZOLOFT) 50 MG tablet Take 50 mg by mouth daily. for depression status post acute CVA  . sodium chloride (OCEAN) 0.65 % SOLN nasal spray Place 1 spray into both nostrils daily as needed for congestion.   Marland Kitchen warfarin (COUMADIN) 1 MG tablet Take 1 mg by mouth at bedtime. give with 6 mg tab for total of 7 mg nightly for CVA/DVT/PE  . warfarin (COUMADIN) 6 MG tablet Take 6 mg by mouth at bedtime. give with 1 mg tab for total of 7 mg nightly CVA/DVT/PE   No facility-administered  encounter medications on file as of 08/01/2018.      SIGNIFICANT DIAGNOSTIC EXAMS  PREVIOUS:   07-18-18: ct of head: Hypoattenuation of the left corona radiata is compatible with a remote lacunar infarction.  07-19-18: 2-d echo with bubble study: EF 55%;   07-20-18: left knee x-ray: Small left knee effusion without evidence of fracture.  07-20-18: MRI/MRA brain: 1. Multiple foci of ischemia within the left superior pons/cerebral peduncle, left thalamus and left medial temporal lobe have increased in extent from the recent prior MRI. No acute hemorrhage. 2. New occlusion of the left V3 and V4 segments. The proximal extent of the left vertebral artery occlusion continues into the neck below the field of view on this examination.  3. Persistent occlusion of the left PCA and SCA unchanged from prior CT angiogram.   07-21-18: cervical spine x-ray: Mild degenerative changes of the cervical spine with no evidence of fracture or dynamic instability   07-25-18: MRI brain MRA neck and brain:  IMPRESSION:  1.  New foci of acute infarction in the left superior cerebellum, left thalamus, left midbrain and left hippocampus. 2.  Evolving infarcts in the left midbrain, left occipital lobe and bilateral cerebellum. No acute hemorrhage or significant mass effect. 3.  Interval partial recanalization of the distal basilar artery and left PCA. 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  TODAY:   08-01-18: chest x-ray: 1. Borderline cardiomegaly without overt chf. 2. No acute parenchymal pathology evident.   LABS REVIEWED; PREVIOUS:  07-19-18: chol 232; ldl 157; trig 114; hdl 55 tsh 3.43 hgb a1c 6.0  07-25-18: wbc 8.5; hgb 7.8; hct 26.6; mcv 86; plt 233 glucose 108; bun 26; creat 1.0; k+ 3.9; na++ 142; ca 8.7 INR 2.0  07-27-18: INR 1.85   TODAY:   08-01-18: wbc 14.0; hgb 7.4; hct 24.0; mcv 83.;9 plt 329 INR5.74   Review of Systems  Reason unable to perform ROS: has expressive  aphasia   Constitutional: Negative for malaise/fatigue.  Eyes:       Has diplopia   Respiratory: Positive for cough and sputum production. Negative for shortness of breath.   Cardiovascular: Negative for chest pain and leg swelling.  Gastrointestinal: Negative for abdominal pain and constipation.  Musculoskeletal: Negative for back pain, joint pain and myalgias.  Skin: Negative.   Neurological: Negative for dizziness.  Psychiatric/Behavioral: Positive for depression. The patient is nervous/anxious.     Physical Exam Constitutional:      General: She is not in acute distress.    Appearance: She is well-developed. She is not diaphoretic.     Comments: Obese   Eyes:     Comments: Left eye ptosis; is using a patch to right eye  Has diplopia     Neck:     Musculoskeletal: Neck supple.     Thyroid: No thyromegaly.  Cardiovascular:     Rate and Rhythm: Normal rate and regular rhythm.     Pulses: Normal pulses.     Heart sounds: Normal heart sounds.     Comments: Has heart monitor on Pulmonary:     Effort: Pulmonary effort is normal. No respiratory distress.     Breath sounds: Rhonchi present.  Abdominal:     General: Bowel sounds are normal. There is no distension.     Palpations: Abdomen is soft.     Tenderness: There is no abdominal tenderness.  Musculoskeletal:     Right lower leg: No edema.     Left lower leg: No edema.     Comments: Is able to move all extremities Right hemiparesis Has slight weakness in right upper extremity poor fine motor function  Bilateral lower extremity weakness right worse than left.  History of right hip replacement  in May 2019     Lymphadenopathy:     Cervical: No cervical adenopathy.  Skin:    General: Skin is warm and dry.  Neurological:     Mental Status: She is alert and oriented to person, place, and time. Mental status is at baseline.  Psychiatric:        Mood and Affect: Mood normal.        ASSESSMENT/ PLAN:  TODAY:   1.  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 Current use of long term anticoagulation  3. Acute bronchitis  Her goal remains to return back home; she will need supervision upon discharge Will begin augmentin 875 mg twice daily through 08-12-18 Will hold her coumadin        MD is aware of resident's narcotic use and is in agreement with current plan of care. We will attempt to wean resident as apropriate   Sherry Innocenteborah Stephenia Vogan NP [facility]  Contact [phone] Monday through Friday 8am- 5pm  After hours call [phone]

## 2018-08-02 ENCOUNTER — Encounter: Payer: Self-pay | Admitting: Adult Health

## 2018-08-02 ENCOUNTER — Other Ambulatory Visit
Admission: RE | Admit: 2018-08-02 | Discharge: 2018-08-02 | Disposition: A | Discharge status: Home / Self Care | Payer: Medicare Other | Source: Ambulatory Visit | Attending: Internal Medicine | Admitting: Internal Medicine

## 2018-08-02 ENCOUNTER — Non-Acute Institutional Stay (SKILLED_NURSING_FACILITY): Payer: Medicare Other | Admitting: Adult Health

## 2018-08-02 DIAGNOSIS — J209 Acute bronchitis, unspecified: Secondary | ICD-10-CM | POA: Insufficient documentation

## 2018-08-02 DIAGNOSIS — I6322 Cerebral infarction due to unspecified occlusion or stenosis of basilar arteries: Secondary | ICD-10-CM | POA: Diagnosis not present

## 2018-08-02 DIAGNOSIS — I679 Cerebrovascular disease, unspecified: Secondary | ICD-10-CM

## 2018-08-02 DIAGNOSIS — G8191 Hemiplegia, unspecified affecting right dominant side: Secondary | ICD-10-CM

## 2018-08-02 DIAGNOSIS — F329 Major depressive disorder, single episode, unspecified: Secondary | ICD-10-CM | POA: Diagnosis not present

## 2018-08-02 DIAGNOSIS — Z7901 Long term (current) use of anticoagulants: Secondary | ICD-10-CM

## 2018-08-02 LAB — PROTIME-INR
INR: 6.29
Prothrombin Time: 54.5 seconds — ABNORMAL HIGH (ref 11.4–15.2)

## 2018-08-02 NOTE — Progress Notes (Addendum)
Location:   [facility] Room Number: 200 Place of Service:  SNF (31)   CODE STATUS: dnr  Allergies  Allergen Reactions  . Celecoxib Swelling  . Pregabalin Other (See Comments)    Dizziness    Chief Complaint  Patient presents with  . Acute Visit    INR/ mood     HPI:  Staff report that she is more depressed. She did state that she would be better off dead; but has no plans for suicide. Her INR is elevated at 6.29. she denies any uncontrolled pain; she continues to have double vision.  She denies any insomnia. She does continue to participate in therapy. She is on long term coumadin therapy due to her multiple cvas. There are no reports of missed doses; no reports of abnormal bleeding. She is tolerating coumadin without difficulty.   Past Medical History:  Diagnosis Date  . Anxiety   . Arthritis   . Breast cyst   . Chronic kidney disease    stage III  . Chronic kidney disease, stage III (moderate) (HCC) 12/24/2016   Overview: GFR = 51  . Chronic pain 06/04/2015  . Hypertension   . Lipidemia    mild  . Lumbar back pain 12/24/2016  . Neuropathy 06/04/2015  . Osteoarthritis   . Osteoporosis   . Shingles     Past Surgical History:  Procedure Laterality Date  . Breast biopsy Left   . CHOLECYSTECTOMY    . LAMINECTOMY    . TOTAL HIP ARTHROPLASTY Right 12/20/2017   Procedure: TOTAL HIP ARTHROPLASTY;  Surgeon: Donato HeinzHooten, James P, MD;  Location: [facility] ORS;  Service: Orthopedics;  Laterality: Right;  . TOTAL VAGINAL HYSTERECTOMY      Social History   Socioeconomic History  . Marital status: Married    Spouse name: Not on file  . Number of children: 1  . Years of education: 10812  . Highest education level: High school graduate  Occupational History  . Not on file  Social Needs  . Financial resource strain: Not on file  . Food insecurity:    Worry: Not on file    Inability: Not on file  . Transportation needs:    Medical: Not on file    Non-medical:  Not on file  Tobacco Use  . Smoking status: Never Smoker  . Smokeless tobacco: Never Used  Substance and Sexual Activity  . Alcohol use: No    Alcohol/week: 0.0 standard drinks  . Drug use: No  . Sexual activity: Not Currently  Lifestyle  . Physical activity:    Days per week: Not on file    Minutes per session: Not on file  . Stress: Not on file  Relationships  . Social connections:    Talks on phone: Not on file    Gets together: Not on file    Attends religious service: Not on file    Active member of club or organization: Not on file    Attends meetings of clubs or organizations: Not on file    Relationship status: Not on file  . Intimate partner violence:    Fear of current or ex partner: Not on file    Emotionally abused: Not on file    Physically abused: Not on file    Forced sexual activity: Not on file  Other Topics Concern  . Not on file  Social History Narrative  . Not on file   Family History  Problem Relation Age of Onset  .  Diabetes type II Sister   . Heart disease Sister   . CAD Father   . Heart disease Father   . Diabetes type II Mother       VITAL SIGNS BP (!) 147/76   Pulse 84   Temp 98.8 F (37.1 C)   Resp 18   Ht 5\' 4"  (1.626 m)   Wt 203 lb (92.1 kg)   SpO2 94%   BMI 34.84 kg/m   Outpatient Encounter Medications as of 08/02/2018  Medication Sig  . acetaminophen (TYLENOL) 325 MG tablet Take 650 mg by mouth every 4 (four) hours as needed. for pain/ increased temp. May be administered orally, per G-tube if needed or rectally if unable to swallow (separate order). Maximum dose for 24 hours is 3,000 mg from all sources of Acetaminophen/ Tylenol  . albuterol (PROVENTIL HFA;VENTOLIN HFA) 108 (90 Base) MCG/ACT inhaler Inhale 1 puff into the lungs every 6 (six) hours.  . Alpha-Lipoic Acid 600 MG CAPS Take 600 mg by mouth daily.   Marland Kitchen. amoxicillin-clavulanate (AUGMENTIN) 875-125 MG tablet Take 1 tablet by mouth 2 (two) times daily.  Marland Kitchen. aspirin 81 MG  chewable tablet Chew 81 mg by mouth daily.  Marland Kitchen. atorvastatin (LIPITOR) 80 MG tablet Take 80 mg by mouth daily.  . B Complex Vitamins (B COMPLEX 100 PO) Take 1 tablet by mouth daily  . Calcium Carb-Cholecalciferol (CALCIUM/VITAMIN D) 500-200 MG-UNIT TABS Take 1 tablet by mouth 2 (two) times daily.  . Cholecalciferol (VITAMIN D3) 5000 units TABS Take 5,000 Units by mouth daily.  . Coenzyme Q10 100 MG capsule Take 100 mg by mouth daily.   Marland Kitchen. dextromethorphan-guaiFENesin (MUCINEX DM) 30-600 MG 12hr tablet Take 1 tablet by mouth 2 (two) times daily.  Marland Kitchen. lidocaine (LIDODERM) 5 % Apply 1 patch onto skin to the most painful area daily.  Remove & Discard patch within 12 hours or as directed by MD  . Liniments (SALONPAS PAIN RELIEF PATCH EX) Place 1 patch onto the skin daily as needed (FOR PAIN.). apply to affected area  . Magnesium Oxide 200 MG TABS Take 1 tablet by mouth daily.  . Methylsulfonylmethane (MSM) 1000 MG TABS Take 3,000 mg by mouth daily.   . NON FORMULARY Diet type:  NAS  . omega-3 acid ethyl esters (LOVAZA) 1 g capsule Take 1 g by mouth at bedtime.  . Probiotic Product (RISA-BID PROBIOTIC PO) Take 1 tablet by mouth 2 (two) times daily.  . sertraline (ZOLOFT) 50 MG tablet Take 50 mg by mouth daily. for depression status post acute CVA  . sodium chloride (OCEAN) 0.65 % SOLN nasal spray Place 1 spray into both nostrils daily as needed for congestion.   Marland Kitchen. warfarin (COUMADIN) 1 MG tablet Take 1 mg by mouth at bedtime. give with 6 mg tab for total of 7 mg nightly for CVA/DVT/PE  . warfarin (COUMADIN) 6 MG tablet Take 6 mg by mouth at bedtime. give with 1 mg tab for total of 7 mg nightly CVA/DVT/PE   No facility-administered encounter medications on file as of 08/02/2018.      SIGNIFICANT DIAGNOSTIC EXAMS   PREVIOUS:   07-18-18: ct of head: Hypoattenuation of the left corona radiata is compatible with a remote lacunar infarction.  07-19-18: 2-d echo with bubble study: EF 55%;   07-20-18:  left knee x-ray: Small left knee effusion without evidence of fracture.  07-20-18: MRI/MRA brain: 1. Multiple foci of ischemia within the left superior pons/cerebral peduncle, left thalamus and left medial temporal lobe have  increased in extent from the recent prior MRI. No acute hemorrhage. 2. New occlusion of the left V3 and V4 segments. The proximal extent of the left vertebral artery occlusion continues into the neck below the field of view on this examination.  3. Persistent occlusion of the left PCA and SCA unchanged from prior CT angiogram.   07-21-18: cervical spine x-ray: Mild degenerative changes of the cervical spine with no evidence of fracture or dynamic instability   07-25-18: MRI brain MRA neck and brain:  IMPRESSION:  1.  New foci of acute infarction in the left superior cerebellum, left thalamus, left midbrain and left hippocampus. 2.  Evolving infarcts in the left midbrain, left occipital lobe and bilateral cerebellum. No acute hemorrhage or significant mass effect. 3.  Interval partial recanalization of the distal basilar artery and left PCA. 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  08-01-18: chest x-ray: 1. Borderline cardiomegaly without overt chf. 2. No acute parenchymal pathology evident.   NO NEW EXAMS.   LABS REVIEWED; PREVIOUS:  07-19-18: chol 232; ldl 157; trig 114; hdl 55 tsh 3.43 hgb a1c 6.0  07-25-18: wbc 8.5; hgb 7.8; hct 26.6; mcv 86; plt 233 glucose 108; bun 26; creat 1.0; k+ 3.9; na++ 142; ca 8.7 INR 2.0  07-27-18: INR 1.85  08-01-18: wbc 14.0; hgb 7.4; hct 24.0; mcv 83.;9 plt 329 INR5.74  TODAY:   08-02-18: INR 6.29   Review of Systems  Constitutional: Negative for malaise/fatigue.  Respiratory: Negative for cough and shortness of breath.   Cardiovascular: Negative for chest pain, palpitations and leg swelling.  Gastrointestinal: Negative for abdominal pain, constipation and heartburn.  Musculoskeletal: Negative for back  pain, joint pain and myalgias.  Skin: Negative.   Neurological: Negative for dizziness.       Has expressive aphasia  Has double vision   Psychiatric/Behavioral: Positive for depression. Negative for suicidal ideas. The patient is not nervous/anxious and does not have insomnia.     Physical Exam Constitutional:      General: She is not in acute distress.    Appearance: She is well-developed. She is obese. She is not diaphoretic.  Eyes:     Comments: Left eye ptosis; is using a patch to right eye  Has diplopia      Neck:     Musculoskeletal: Neck supple.     Thyroid: No thyromegaly.  Cardiovascular:     Rate and Rhythm: Normal rate and regular rhythm.     Pulses: Normal pulses.     Heart sounds: Normal heart sounds.     Comments: Heart monitor on  Pulmonary:     Effort: Pulmonary effort is normal. No respiratory distress.     Breath sounds: Normal breath sounds.  Abdominal:     General: Bowel sounds are normal. There is no distension.     Palpations: Abdomen is soft.     Tenderness: There is no abdominal tenderness.  Musculoskeletal:     Left lower leg: No edema.     Comments: Is able to move all extremities Right hemiparesis Has slight weakness in right upper extremity poor fine motor function  Bilateral lower extremity weakness right worse than left.  History of right hip replacement in May 2019      Lymphadenopathy:     Cervical: No cervical adenopathy.  Skin:    General: Skin is warm and dry.  Neurological:     Mental Status: She is alert. Mental status is at baseline.  Psychiatric:  Mood and Affect: Mood normal.      ASSESSMENT/ PLAN:  TODAY:  1. 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 Hemiparesis of right dominant side due to cerebrovascular disease 3. Current use of long term anticoagulation 4. Reactive situational depression  Will increase her zoloft to 100 mg daily Will hold coumadin Will check INR on 08-04-18.   MD is  aware of resident's narcotic use and is in agreement with current plan of care. We will attempt to wean resident as apropriate   Synthia Innocent NP [facility]  Contact [phone] Monday through Friday 8am- 5pm  After hours call [phone]

## 2018-08-03 DIAGNOSIS — F329 Major depressive disorder, single episode, unspecified: Secondary | ICD-10-CM | POA: Insufficient documentation

## 2018-08-04 ENCOUNTER — Other Ambulatory Visit
Admission: RE | Admit: 2018-08-04 | Discharge: 2018-08-04 | Disposition: A | Discharge status: Home / Self Care | Payer: Medicare Other | Source: Ambulatory Visit | Attending: Adult Health | Admitting: Adult Health

## 2018-08-04 ENCOUNTER — Non-Acute Institutional Stay (SKILLED_NURSING_FACILITY): Payer: Medicare Other | Admitting: Adult Health

## 2018-08-04 DIAGNOSIS — I6322 Cerebral infarction due to unspecified occlusion or stenosis of basilar arteries: Secondary | ICD-10-CM

## 2018-08-04 DIAGNOSIS — Z7901 Long term (current) use of anticoagulants: Secondary | ICD-10-CM | POA: Diagnosis not present

## 2018-08-04 LAB — PROTIME-INR
INR: 3.58
Prothrombin Time: 35.2 seconds — ABNORMAL HIGH (ref 11.4–15.2)

## 2018-08-05 ENCOUNTER — Encounter: Payer: Self-pay | Admitting: Adult Health

## 2018-08-05 ENCOUNTER — Other Ambulatory Visit
Admission: RE | Admit: 2018-08-05 | Discharge: 2018-08-05 | Disposition: A | Discharge status: Home / Self Care | Payer: Medicare Other | Source: Ambulatory Visit | Attending: Adult Health | Admitting: Adult Health

## 2018-08-05 DIAGNOSIS — I69398 Other sequelae of cerebral infarction: Secondary | ICD-10-CM | POA: Diagnosis present

## 2018-08-05 LAB — PROTIME-INR
INR: 2.25
Prothrombin Time: 24.6 seconds — ABNORMAL HIGH (ref 11.4–15.2)

## 2018-08-05 NOTE — Progress Notes (Addendum)
Location:    [facility] Room Number: 2 Place of Service:  SNF (31)   CODE STATUS: dnr  Allergies  Allergen Reactions  . Celecoxib Swelling  . Pregabalin Other (See Comments)    Dizziness    Chief Complaint  Patient presents with  . Acute Visit    coumadin management     HPI:  She is on chronic coumadin therapy due to CVA's. Her coumadin has been placed on hold due to elevated INR. There are no reports of abnormal bleeding. She denies any chest pain; she denies any uncontrolled pain.   Past Medical History:  Diagnosis Date  . Anxiety   . Arthritis   . Breast cyst   . Chronic kidney disease    stage III  . Chronic kidney disease, stage III (moderate) (HCC) 12/24/2016   Overview: GFR = 51  . Chronic pain 06/04/2015  . Hypertension   . Lipidemia    mild  . Lumbar back pain 12/24/2016  . Neuropathy 06/04/2015  . Osteoarthritis   . Osteoporosis   . Shingles     Past Surgical History:  Procedure Laterality Date  . Breast biopsy Left   . CHOLECYSTECTOMY    . LAMINECTOMY    . TOTAL HIP ARTHROPLASTY Right 12/20/2017   Procedure: TOTAL HIP ARTHROPLASTY;  Surgeon: Donato HeinzHooten, James P, MD;  Location: [facility] ORS;  Service: Orthopedics;  Laterality: Right;  . TOTAL VAGINAL HYSTERECTOMY      Social History   Socioeconomic History  . Marital status: Married    Spouse name: Not on file  . Number of children: 1  . Years of education: 1512  . Highest education level: High school graduate  Occupational History  . Not on file  Social Needs  . Financial resource strain: Not on file  . Food insecurity:    Worry: Not on file    Inability: Not on file  . Transportation needs:    Medical: Not on file    Non-medical: Not on file  Tobacco Use  . Smoking status: Never Smoker  . Smokeless tobacco: Never Used  Substance and Sexual Activity  . Alcohol use: No    Alcohol/week: 0.0 standard drinks  . Drug use: No  . Sexual activity: Not Currently  Lifestyle  .  Physical activity:    Days per week: Not on file    Minutes per session: Not on file  . Stress: Not on file  Relationships  . Social connections:    Talks on phone: Not on file    Gets together: Not on file    Attends religious service: Not on file    Active member of club or organization: Not on file    Attends meetings of clubs or organizations: Not on file    Relationship status: Not on file  . Intimate partner violence:    Fear of current or ex partner: Not on file    Emotionally abused: Not on file    Physically abused: Not on file    Forced sexual activity: Not on file  Other Topics Concern  . Not on file  Social History Narrative  . Not on file   Family History  Problem Relation Age of Onset  . Diabetes type II Sister   . Heart disease Sister   . CAD Father   . Heart disease Father   . Diabetes type II Mother       VITAL SIGNS BP (!) 160/56   Pulse 77  Temp 98.1 F (36.7 C)   Resp 16   Ht 5\' 4"  (1.626 m)   Wt 203 lb (92.1 kg)   SpO2 93%   BMI 34.84 kg/m   Outpatient Encounter Medications as of 08/04/2018  Medication Sig  . acetaminophen (TYLENOL) 325 MG tablet Take 650 mg by mouth every 4 (four) hours as needed. for pain/ increased temp. May be administered orally, per G-tube if needed or rectally if unable to swallow (separate order). Maximum dose for 24 hours is 3,000 mg from all sources of Acetaminophen/ Tylenol  . albuterol (PROVENTIL HFA;VENTOLIN HFA) 108 (90 Base) MCG/ACT inhaler Inhale 1 puff into the lungs every 6 (six) hours.  . Alpha-Lipoic Acid 600 MG CAPS Take 600 mg by mouth daily.   Marland Kitchen amoxicillin-clavulanate (AUGMENTIN) 875-125 MG tablet Take 1 tablet by mouth 2 (two) times daily.  Marland Kitchen aspirin 81 MG chewable tablet Chew 81 mg by mouth daily.  Marland Kitchen atorvastatin (LIPITOR) 80 MG tablet Take 80 mg by mouth daily.  . B Complex Vitamins (B COMPLEX 100 PO) Take 1 tablet by mouth daily  . Calcium Carb-Cholecalciferol (CALCIUM/VITAMIN D) 500-200 MG-UNIT  TABS Take 1 tablet by mouth 2 (two) times daily.  . Cholecalciferol (VITAMIN D3) 5000 units TABS Take 5,000 Units by mouth daily.  . Coenzyme Q10 100 MG capsule Take 100 mg by mouth daily.   Marland Kitchen dextromethorphan-guaiFENesin (MUCINEX DM) 30-600 MG 12hr tablet Take 1 tablet by mouth 2 (two) times daily.  Marland Kitchen lidocaine (LIDODERM) 5 % Apply 1 patch onto skin to the most painful area daily.  Remove & Discard patch within 12 hours or as directed by MD  . Liniments (SALONPAS PAIN RELIEF PATCH EX) Place 1 patch onto the skin daily as needed (FOR PAIN.). apply to affected area  . Magnesium Oxide 200 MG TABS Take 1 tablet by mouth daily.  . Methylsulfonylmethane (MSM) 1000 MG TABS Take 3,000 mg by mouth daily.   . NON FORMULARY Diet type:  NAS  . omega-3 acid ethyl esters (LOVAZA) 1 g capsule Take 1 g by mouth at bedtime.  . Probiotic Product (RISA-BID PROBIOTIC PO) Take 1 tablet by mouth 2 (two) times daily.  . sertraline (ZOLOFT) 50 MG tablet Take 100 mg by mouth daily. for depression status post acute CVA  . sodium chloride (OCEAN) 0.65 % SOLN nasal spray Place 1 spray into both nostrils daily as needed for congestion.   . [DISCONTINUED] warfarin (COUMADIN) 1 MG tablet Take 1 mg by mouth at bedtime. give with 6 mg tab for total of 7 mg nightly for CVA/DVT/PE  . [DISCONTINUED] warfarin (COUMADIN) 6 MG tablet Take 6 mg by mouth at bedtime. give with 1 mg tab for total of 7 mg nightly CVA/DVT/PE   No facility-administered encounter medications on file as of 08/04/2018.      SIGNIFICANT DIAGNOSTIC EXAMS    PREVIOUS:   07-18-18: ct of head: Hypoattenuation of the left corona radiata is compatible with a remote lacunar infarction.  07-19-18: 2-d echo with bubble study: EF 55%;   07-20-18: left knee x-ray: Small left knee effusion without evidence of fracture.  07-20-18: MRI/MRA brain: 1. Multiple foci of ischemia within the left superior pons/cerebral peduncle, left thalamus and left medial temporal  lobe have increased in extent from the recent prior MRI. No acute hemorrhage. 2. New occlusion of the left V3 and V4 segments. The proximal extent of the left vertebral artery occlusion continues into the neck below the field of view on this examination.  3. Persistent occlusion of the left PCA and SCA unchanged from prior CT angiogram.   07-21-18: cervical spine x-ray: Mild degenerative changes of the cervical spine with no evidence of fracture or dynamic instability   07-25-18: MRI brain MRA neck and brain:  IMPRESSION:  1.  New foci of acute infarction in the left superior cerebellum, left thalamus, left midbrain and left hippocampus. 2.  Evolving infarcts in the left midbrain, left occipital lobe and bilateral cerebellum. No acute hemorrhage or significant mass effect. 3.  Interval partial recanalization of the distal basilar artery and left PCA. 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  08-01-18: chest x-ray: 1. Borderline cardiomegaly without overt chf. 2. No acute parenchymal pathology evident.   NO NEW EXAMS.   LABS REVIEWED; PREVIOUS:  07-19-18: chol 232; ldl 157; trig 114; hdl 55 tsh 3.43 hgb a1c 6.0  07-25-18: wbc 8.5; hgb 7.8; hct 26.6; mcv 86; plt 233 glucose 108; bun 26; creat 1.0; k+ 3.9; na++ 142; ca 8.7 INR 2.0  07-27-18: INR 1.85  08-01-18: wbc 14.0; hgb 7.4; hct 24.0; mcv 83.;9 plt 329 INR5.74 08-02-18: INR 6.29  TODAY:   08-04-18: INR 3.58    Review of Systems  Constitutional: Negative for malaise/fatigue.  Eyes:       Has double vision  Respiratory: Negative for cough and shortness of breath.   Cardiovascular: Negative for chest pain, palpitations and leg swelling.  Gastrointestinal: Negative for abdominal pain, constipation and heartburn.  Musculoskeletal: Negative for back pain, joint pain and myalgias.  Skin: Negative.   Neurological: Positive for weakness. Negative for dizziness.       Has expressive aphasia     Psychiatric/Behavioral: The patient is not nervous/anxious.     Physical Exam Constitutional:      General: She is not in acute distress.    Appearance: Normal appearance. She is well-developed. She is obese. She is not diaphoretic.  Eyes:     Comments:  Left eye ptosis; is using a patch to right eye  Has diplopia      Neck:     Musculoskeletal: Neck supple.     Thyroid: No thyromegaly.  Cardiovascular:     Rate and Rhythm: Normal rate and regular rhythm.     Pulses: Normal pulses.     Heart sounds: Normal heart sounds.     Comments: Heart monitor in place Pulmonary:     Effort: Pulmonary effort is normal. No respiratory distress.     Breath sounds: Normal breath sounds.  Abdominal:     General: Bowel sounds are normal. There is no distension.     Palpations: Abdomen is soft.     Tenderness: There is no abdominal tenderness.  Musculoskeletal:     Right lower leg: No edema.     Left lower leg: No edema.     Comments: Is able to move all extremities Has mild right side weakness History of right hip replacement in May 2019  Lymphadenopathy:     Cervical: No cervical adenopathy.  Skin:    General: Skin is warm and dry.  Neurological:     Mental Status: She is alert and oriented to person, place, and time.       ASSESSMENT/ PLAN:  TODAY:   1. 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 Current use of long term anticoagulation  Will hold her coumadin today and will repeat INR in the AM. Will continue to monitor her status.   MD is aware of resident's  narcotic use and is in agreement with current plan of care. We will attempt to wean resident as apropriate   Synthia Innocenteborah Ariellah Faust NP [facility]  Contact [phone] Monday through Friday 8am- 5pm  After hours call [phone]  .

## 2018-08-09 ENCOUNTER — Encounter: Payer: Self-pay | Admitting: Adult Health

## 2018-08-09 ENCOUNTER — Non-Acute Institutional Stay (SKILLED_NURSING_FACILITY): Payer: Medicare Other | Admitting: Adult Health

## 2018-08-09 DIAGNOSIS — N183 Chronic kidney disease, stage 3 unspecified: Secondary | ICD-10-CM

## 2018-08-09 DIAGNOSIS — J208 Acute bronchitis due to other specified organisms: Secondary | ICD-10-CM | POA: Diagnosis not present

## 2018-08-09 DIAGNOSIS — I10 Essential (primary) hypertension: Secondary | ICD-10-CM

## 2018-08-09 DIAGNOSIS — M150 Primary generalized (osteo)arthritis: Secondary | ICD-10-CM | POA: Diagnosis not present

## 2018-08-09 DIAGNOSIS — M159 Polyosteoarthritis, unspecified: Secondary | ICD-10-CM

## 2018-08-09 NOTE — Progress Notes (Signed)
Location:   [facility] Room Number: 217 A Place of Service:  SNF (31)   CODE STATUS: DNR  Allergies  Allergen Reactions  . Celecoxib Swelling  . Pregabalin Other (See Comments)    Dizziness    Chief Complaint  Patient presents with  . Medical Management of Chronic Issues    Essential (primary) hypertension; primary osteoarthritis involving multiple joints; CKD (chroinc kidney disease) stage III; acute bronchitis. Weekly follow up for the first 30 days post hospitalization.     HPI:  She is a 84 year old short term rehab patient being seen for the management of her chronic illnesses; hypertension; osteoarthritis; ckd. She continues abt for her bronchitis; states that she is feeling better. She denies any uncontrolled pain; cough; no fevers; no anxiety present.   Past Medical History:  Diagnosis Date  . Anxiety   . Arthritis   . Breast cyst   . Chronic kidney disease    stage III  . Chronic kidney disease, stage III (moderate) (HCC) 12/24/2016   Overview: GFR = 51  . Chronic pain 06/04/2015  . Hypertension   . Lipidemia    mild  . Lumbar back pain 12/24/2016  . Neuropathy 06/04/2015  . Osteoarthritis   . Osteoporosis   . Shingles     Past Surgical History:  Procedure Laterality Date  . Breast biopsy Left   . CHOLECYSTECTOMY    . LAMINECTOMY    . TOTAL HIP ARTHROPLASTY Right 12/20/2017   Procedure: TOTAL HIP ARTHROPLASTY;  Surgeon: Donato Heinz, MD;  Location: [facility] ORS;  Service: Orthopedics;  Laterality: Right;  . TOTAL VAGINAL HYSTERECTOMY      Social History   Socioeconomic History  . Marital status: Married    Spouse name: Not on file  . Number of children: 1  . Years of education: 43  . Highest education level: High school graduate  Occupational History  . Not on file  Social Needs  . Financial resource strain: Not on file  . Food insecurity:    Worry: Not on file    Inability: Not on file  . Transportation needs:      Medical: Not on file    Non-medical: Not on file  Tobacco Use  . Smoking status: Never Smoker  . Smokeless tobacco: Never Used  Substance and Sexual Activity  . Alcohol use: No    Alcohol/week: 0.0 standard drinks  . Drug use: No  . Sexual activity: Not Currently  Lifestyle  . Physical activity:    Days per week: Not on file    Minutes per session: Not on file  . Stress: Not on file  Relationships  . Social connections:    Talks on phone: Not on file    Gets together: Not on file    Attends religious service: Not on file    Active member of club or organization: Not on file    Attends meetings of clubs or organizations: Not on file    Relationship status: Not on file  . Intimate partner violence:    Fear of current or ex partner: Not on file    Emotionally abused: Not on file    Physically abused: Not on file    Forced sexual activity: Not on file  Other Topics Concern  . Not on file  Social History Narrative  . Not on file   Family History  Problem Relation Age of Onset  . Diabetes type II Sister   .  Heart disease Sister   . CAD Father   . Heart disease Father   . Diabetes type II Mother       VITAL SIGNS BP (!) 154/64   Pulse 80   Temp 97.9 F (36.6 C)   Resp 18   Ht 5\' 4"  (1.626 m)   Wt 205 lb 8 oz (93.2 kg)   SpO2 94%   BMI 35.27 kg/m   Outpatient Encounter Medications as of 08/09/2018  Medication Sig  . acetaminophen (TYLENOL) 325 MG tablet Take 650 mg by mouth every 4 (four) hours as needed. for pain/ increased temp. May be administered orally, per G-tube if needed or rectally if unable to swallow (separate order). Maximum dose for 24 hours is 3,000 mg from all sources of Acetaminophen/ Tylenol  . Alpha-Lipoic Acid 600 MG CAPS Take 600 mg by mouth daily.   Marland Kitchen. amoxicillin-clavulanate (AUGMENTIN) 875-125 MG tablet Take 1 tablet by mouth 2 (two) times daily.  Marland Kitchen. aspirin 81 MG chewable tablet Chew 81 mg by mouth daily.  Marland Kitchen. atorvastatin (LIPITOR) 80 MG  tablet Take 80 mg by mouth daily.  . B Complex Vitamins (B COMPLEX 100 PO) Take 1 tablet by mouth daily  . Calcium Carb-Cholecalciferol (CALCIUM/VITAMIN D) 500-200 MG-UNIT TABS Take 1 tablet by mouth 2 (two) times daily.  . Cholecalciferol (VITAMIN D3) 5000 units TABS Take 5,000 Units by mouth daily.  . Coenzyme Q10 100 MG capsule Take 100 mg by mouth daily.   Marland Kitchen. ipratropium-albuterol (DUONEB) 0.5-2.5 (3) MG/3ML SOLN Take 3 mLs by nebulization every 6 (six) hours.  . lidocaine (LIDODERM) 5 % Apply 1 patch onto skin to the most painful area daily.  Remove & Discard patch within 12 hours or as directed by MD  . Liniments (SALONPAS PAIN RELIEF PATCH EX) Place 1 patch onto the skin daily as needed (FOR PAIN.). apply to affected area  . Magnesium Oxide 200 MG TABS Take 1 tablet by mouth daily.  . Methylsulfonylmethane (MSM) 1000 MG TABS Take 3,000 mg by mouth daily.   . NON FORMULARY Diet type:  NAS  . omega-3 acid ethyl esters (LOVAZA) 1 g capsule Take 1 g by mouth at bedtime.  . Probiotic Product (RISA-BID PROBIOTIC PO) Take 1 tablet by mouth 2 (two) times daily.  . sertraline (ZOLOFT) 100 MG tablet Take 100 mg by mouth daily. for depression status post acute CVA   . sodium chloride (OCEAN) 0.65 % SOLN nasal spray Place 1 spray into both nostrils daily as needed for congestion.   Marland Kitchen. warfarin (COUMADIN) 6 MG tablet Take 6 mg by mouth daily.  . [DISCONTINUED] albuterol (PROVENTIL HFA;VENTOLIN HFA) 108 (90 Base) MCG/ACT inhaler Inhale 1 puff into the lungs every 6 (six) hours.   No facility-administered encounter medications on file as of 08/09/2018.      SIGNIFICANT DIAGNOSTIC EXAMS  PREVIOUS:   07-18-18: ct of head: Hypoattenuation of the left corona radiata is compatible with a remote lacunar infarction.  07-19-18: 2-d echo with bubble study: EF 55%;   07-20-18: left knee x-ray: Small left knee effusion without evidence of fracture.  07-20-18: MRI/MRA brain: 1. Multiple foci of ischemia  within the left superior pons/cerebral peduncle, left thalamus and left medial temporal lobe have increased in extent from the recent prior MRI. No acute hemorrhage. 2. New occlusion of the left V3 and V4 segments. The proximal extent of the left vertebral artery occlusion continues into the neck below the field of view on this examination.  3. Persistent  occlusion of the left PCA and SCA unchanged from prior CT angiogram.   07-21-18: cervical spine x-ray: Mild degenerative changes of the cervical spine with no evidence of fracture or dynamic instability   07-25-18: MRI brain MRA neck and brain:  IMPRESSION:  1.  New foci of acute infarction in the left superior cerebellum, left thalamus, left midbrain and left hippocampus. 2.  Evolving infarcts in the left midbrain, left occipital lobe and bilateral cerebellum. No acute hemorrhage or significant mass effect. 3.  Interval partial recanalization of the distal basilar artery and left PCA. 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  08-01-18: chest x-ray: 1. Borderline cardiomegaly without overt chf. 2. No acute parenchymal pathology evident.   NO NEW EXAMS.   LABS REVIEWED; PREVIOUS:  07-19-18: chol 232; ldl 157; trig 114; hdl 55 tsh 3.43 hgb a1c 6.0  07-25-18: wbc 8.5; hgb 7.8; hct 26.6; mcv 86; plt 233 glucose 108; bun 26; creat 1.0; k+ 3.9; na++ 142; ca 8.7 INR 2.0  07-27-18: INR 1.85  08-01-18: wbc 14.0; hgb 7.4; hct 24.0; mcv 83.;9 plt 329 INR5.74 08-02-18: INR 6.29 08-04-18: INR 3.58   TODAY:   08-05-18: tsh 2.25   Review of Systems  Constitutional: Negative for malaise/fatigue.  Respiratory: Negative for cough and shortness of breath.   Cardiovascular: Negative for chest pain, palpitations and leg swelling.  Gastrointestinal: Negative for abdominal pain, constipation and heartburn.  Musculoskeletal: Negative for back pain, joint pain and myalgias.  Skin: Negative.   Neurological: Negative for dizziness.    Psychiatric/Behavioral: The patient is not nervous/anxious.     Physical Exam Constitutional:      General: She is not in acute distress.    Appearance: She is well-developed. She is obese. She is not diaphoretic.  Eyes:     Comments: Left eye ptosis; is using a patch to right eye  Has diplopia       Neck:     Thyroid: No thyromegaly.  Cardiovascular:     Rate and Rhythm: Normal rate and regular rhythm.     Pulses: Normal pulses.     Heart sounds: Normal heart sounds.     Comments: Heart monitor in place  Pulmonary:     Effort: Pulmonary effort is normal. No respiratory distress.     Breath sounds: Normal breath sounds.  Abdominal:     General: Bowel sounds are normal. There is no distension.     Palpations: Abdomen is soft.     Tenderness: There is no abdominal tenderness.  Musculoskeletal:     Right lower leg: No edema.     Left lower leg: No edema.     Comments: Is able to move all extremities Has mild right side weakness History of right hip replacement in May 2019   Lymphadenopathy:     Cervical: No cervical adenopathy.  Skin:    General: Skin is warm and dry.  Neurological:     Mental Status: She is alert. Mental status is at baseline.  Psychiatric:        Mood and Affect: Mood normal.       ASSESSMENT/ PLAN:  TODAY:   1. Essential hypertension: is stable: b/p 154/64: will monitor  2. Primary osteoarthritis involving multiple joints:is status post right hip (12/2017) is stable is taking MSM 3,000 mg daily uses a lidoderm and salon pas patches  3. CKD (chronic kidney disease) stage III: is stable bun 26; creat 1.0 will monitor  4. Acute bronchitis: is improving will complete  augmentin and duonebs.    PREVIOUS   5. Mixed hyperlipidemia: is stable LDL 157; lipitor 80 mg daily and lovasa 1 gm daily   6. 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 Diplopia/left ptosis: is stable will continue patch to the right eye  8. Chronic anemia: is without change hgb 7.4     MD is aware of resident's narcotic use and is in agreement with current plan of care. We will attempt to wean resident as apropriate   Synthia Innocenteborah Green NP [facility]  Contact [phone] Monday through Friday 8am- 5pm  After hours call [phone]

## 2018-08-11 ENCOUNTER — Encounter: Payer: Self-pay | Admitting: Adult Health

## 2018-08-11 ENCOUNTER — Encounter
Admission: RE | Admit: 2018-08-11 | Discharge: 2018-08-11 | Disposition: A | Discharge status: Home / Self Care | Payer: Medicare Other | Source: Ambulatory Visit | Attending: Internal Medicine | Admitting: Internal Medicine

## 2018-08-11 ENCOUNTER — Non-Acute Institutional Stay (SKILLED_NURSING_FACILITY): Payer: Medicare Other | Admitting: Adult Health

## 2018-08-11 ENCOUNTER — Other Ambulatory Visit
Admission: RE | Admit: 2018-08-11 | Discharge: 2018-08-11 | Disposition: A | Discharge status: Home / Self Care | Payer: Medicare Other | Source: Ambulatory Visit | Attending: Adult Health | Admitting: Adult Health

## 2018-08-11 DIAGNOSIS — I679 Cerebrovascular disease, unspecified: Secondary | ICD-10-CM

## 2018-08-11 DIAGNOSIS — G8191 Hemiplegia, unspecified affecting right dominant side: Secondary | ICD-10-CM

## 2018-08-11 DIAGNOSIS — Z7901 Long term (current) use of anticoagulants: Secondary | ICD-10-CM | POA: Diagnosis not present

## 2018-08-11 DIAGNOSIS — I69398 Other sequelae of cerebral infarction: Secondary | ICD-10-CM | POA: Diagnosis present

## 2018-08-11 DIAGNOSIS — I6322 Cerebral infarction due to unspecified occlusion or stenosis of basilar arteries: Secondary | ICD-10-CM

## 2018-08-11 LAB — PROTIME-INR
INR: 2.36
Prothrombin Time: 25.5 seconds — ABNORMAL HIGH (ref 11.4–15.2)

## 2018-08-11 NOTE — Progress Notes (Signed)
[facility]   [facility] Room Number: 217 A Place of Service:  SNF (31)   CODE STATUS: DNR  Allergies  Allergen Reactions  . Celecoxib Swelling  . Pregabalin Other (See Comments)    Dizziness    Chief Complaint  Patient presents with  . Acute Visit    INR Follow up    HPI:  She is on chronic coumadin therapy multiple cva. There are no reports of missed doses. There are no reports of abnormal bleeding. She denies any chest pain no shortness of breath; no worsening weakness. Her INR today is 2.36 which is in her therapeutic range.   Past Medical History:  Diagnosis Date  . Anxiety   . Arthritis   . Breast cyst   . Chronic kidney disease    stage III  . Chronic kidney disease, stage III (moderate) (HCC) 12/24/2016   Overview: GFR = 51  . Chronic pain 06/04/2015  . Hypertension   . Lipidemia    mild  . Lumbar back pain 12/24/2016  . Neuropathy 06/04/2015  . Osteoarthritis   . Osteoporosis   . Shingles     Past Surgical History:  Procedure Laterality Date  . Breast biopsy Left   . CHOLECYSTECTOMY    . LAMINECTOMY    . TOTAL HIP ARTHROPLASTY Right 12/20/2017   Procedure: TOTAL HIP ARTHROPLASTY;  Surgeon: Donato HeinzHooten, James P, MD;  Location: [facility] ORS;  Service: Orthopedics;  Laterality: Right;  . TOTAL VAGINAL HYSTERECTOMY      Social History   Socioeconomic History  . Marital status: Married    Spouse name: Not on file  . Number of children: 1  . Years of education: 3912  . Highest education level: High school graduate  Occupational History  . Not on file  Social Needs  . Financial resource strain: Not on file  . Food insecurity:    Worry: Not on file    Inability: Not on file  . Transportation needs:    Medical: Not on file    Non-medical: Not on file  Tobacco Use  . Smoking status: Never Smoker  . Smokeless tobacco: Never Used  Substance and Sexual Activity  . Alcohol use: No    Alcohol/week: 0.0 standard drinks  . Drug use:  No  . Sexual activity: Not Currently  Lifestyle  . Physical activity:    Days per week: Not on file    Minutes per session: Not on file  . Stress: Not on file  Relationships  . Social connections:    Talks on phone: Not on file    Gets together: Not on file    Attends religious service: Not on file    Active member of club or organization: Not on file    Attends meetings of clubs or organizations: Not on file    Relationship status: Not on file  . Intimate partner violence:    Fear of current or ex partner: Not on file    Emotionally abused: Not on file    Physically abused: Not on file    Forced sexual activity: Not on file  Other Topics Concern  . Not on file  Social History Narrative  . Not on file   Family History  Problem Relation Age of Onset  . Diabetes type II Sister   . Heart disease Sister   . CAD Father   . Heart disease Father   . Diabetes type II Mother  VITAL SIGNS BP (!) 138/53   Pulse 74   Temp 98.1 F (36.7 C)   Resp 18   Ht 5\' 4"  (1.626 m)   Wt 205 lb 8 oz (93.2 kg)   SpO2 97%   BMI 35.27 kg/m   Outpatient Encounter Medications as of 08/11/2018  Medication Sig  . acetaminophen (TYLENOL) 325 MG tablet Take 650 mg by mouth every 4 (four) hours as needed. for pain/ increased temp. May be administered orally, per G-tube if needed or rectally if unable to swallow (separate order). Maximum dose for 24 hours is 3,000 mg from all sources of Acetaminophen/ Tylenol  . Alpha-Lipoic Acid 600 MG CAPS Take 600 mg by mouth daily.   Marland Kitchen amoxicillin-clavulanate (AUGMENTIN) 875-125 MG tablet Take 1 tablet by mouth 2 (two) times daily.  Marland Kitchen aspirin 81 MG chewable tablet Chew 81 mg by mouth daily.  Marland Kitchen atorvastatin (LIPITOR) 80 MG tablet Take 80 mg by mouth daily.  . B Complex Vitamins (B COMPLEX 100 PO) Take 1 tablet by mouth daily  . Calcium Carb-Cholecalciferol (CALCIUM/VITAMIN D) 500-200 MG-UNIT TABS Take 1 tablet by mouth 2 (two) times daily.  .  Cholecalciferol (VITAMIN D3) 5000 units TABS Take 5,000 Units by mouth daily.  . Coenzyme Q10 100 MG capsule Take 100 mg by mouth daily.   Marland Kitchen ipratropium-albuterol (DUONEB) 0.5-2.5 (3) MG/3ML SOLN Take 3 mLs by nebulization every 6 (six) hours.  . lidocaine (LIDODERM) 5 % Apply 1 patch onto skin to the most painful area daily.  Remove & Discard patch within 12 hours or as directed by MD  . Liniments (SALONPAS PAIN RELIEF PATCH EX) Place 1 patch onto the skin daily as needed (FOR PAIN.). apply to affected area  . Magnesium Oxide 200 MG TABS Take 1 tablet by mouth daily.  . Methylsulfonylmethane (MSM) 1000 MG TABS Take 3,000 mg by mouth daily.   . NON FORMULARY Diet type:  NAS  . omega-3 acid ethyl esters (LOVAZA) 1 g capsule Take 1 g by mouth at bedtime.  . Probiotic Product (RISA-BID PROBIOTIC PO) Take 1 tablet by mouth 2 (two) times daily.  . sertraline (ZOLOFT) 100 MG tablet Take 100 mg by mouth daily. for depression status post acute CVA   . sodium chloride (OCEAN) 0.65 % SOLN nasal spray Place 1 spray into both nostrils daily as needed for congestion.   Marland Kitchen warfarin (COUMADIN) 6 MG tablet Take 6 mg by mouth daily.   No facility-administered encounter medications on file as of 08/11/2018.      SIGNIFICANT DIAGNOSTIC EXAMS  PREVIOUS:   07-18-18: ct of head: Hypoattenuation of the left corona radiata is compatible with a remote lacunar infarction.  07-19-18: 2-d echo with bubble study: EF 55%;   07-20-18: left knee x-ray: Small left knee effusion without evidence of fracture.  07-20-18: MRI/MRA brain: 1. Multiple foci of ischemia within the left superior pons/cerebral peduncle, left thalamus and left medial temporal lobe have increased in extent from the recent prior MRI. No acute hemorrhage. 2. New occlusion of the left V3 and V4 segments. The proximal extent of the left vertebral artery occlusion continues into the neck below the field of view on this examination.  3. Persistent  occlusion of the left PCA and SCA unchanged from prior CT angiogram.   07-21-18: cervical spine x-ray: Mild degenerative changes of the cervical spine with no evidence of fracture or dynamic instability   07-25-18: MRI brain MRA neck and brain:  IMPRESSION:  1.  New foci  of acute infarction in the left superior cerebellum, left thalamus, left midbrain and left hippocampus. 2.  Evolving infarcts in the left midbrain, left occipital lobe and bilateral cerebellum. No acute hemorrhage or significant mass effect. 3.  Interval partial recanalization of the distal basilar artery and left PCA. 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  08-01-18: chest x-ray: 1. Borderline cardiomegaly without overt chf. 2. No acute parenchymal pathology evident.   NO NEW EXAMS.   LABS REVIEWED; PREVIOUS:  07-19-18: chol 232; ldl 157; trig 114; hdl 55 tsh 3.43 hgb a1c 6.0  07-25-18: wbc 8.5; hgb 7.8; hct 26.6; mcv 86; plt 233 glucose 108; bun 26; creat 1.0; k+ 3.9; na++ 142; ca 8.7 INR 2.0  07-27-18: INR 1.85  08-01-18: wbc 14.0; hgb 7.4; hct 24.0; mcv 83.;9 plt 329 INR5.74 08-02-18: INR 6.29 08-04-18: INR 3.58  08-05-18: tsh 2.25  TODAY:   08-11-18: INR 2.36    Review of Systems  Constitutional: Negative for malaise/fatigue.  Respiratory: Negative for cough and shortness of breath.   Cardiovascular: Negative for chest pain, palpitations and leg swelling.  Gastrointestinal: Negative for abdominal pain, constipation and heartburn.  Musculoskeletal: Negative for back pain, joint pain and myalgias.  Skin: Negative.   Neurological: Negative for dizziness.  Psychiatric/Behavioral: The patient is not nervous/anxious.    Physical Exam Constitutional:      General: She is not in acute distress.    Appearance: She is well-developed. She is obese. She is not diaphoretic.  Eyes:     Comments: Left eye ptosis; is using a patch to right eye  Has diplopia        Neck:     Musculoskeletal: Neck  supple.     Thyroid: No thyromegaly.  Cardiovascular:     Rate and Rhythm: Normal rate and regular rhythm.     Heart sounds: Normal heart sounds.  Pulmonary:     Effort: Pulmonary effort is normal. No respiratory distress.     Breath sounds: Normal breath sounds.  Abdominal:     General: Bowel sounds are normal. There is no distension.     Palpations: Abdomen is soft.     Tenderness: There is no abdominal tenderness.  Musculoskeletal: Normal range of motion.     Right lower leg: No edema.     Left lower leg: No edema.     Comments:  Is able to move all extremities Has mild right side weakness History of right hip replacement in May 2019    Lymphadenopathy:     Cervical: No cervical adenopathy.  Skin:    General: Skin is warm and dry.  Neurological:     Mental Status: She is alert and oriented to person, place, and time. Mental status is at baseline.  Psychiatric:        Mood and Affect: Mood normal.        ASSESSMENT/ PLAN:  TODAY:   1.  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 Current use of long term anticoagulation  For INR 2.36 will continue coumadin 6 mg nightly and will check INR on 08-25-18    MD is aware of resident's narcotic use and is in agreement with current plan of care. We will attempt to wean resident as apropriate   Synthia Innocent NP [facility]  Contact [phone] Monday through Friday 8am- 5pm  After hours call [phone]

## 2018-08-12 ENCOUNTER — Encounter: Payer: Self-pay | Admitting: Adult Health

## 2018-08-12 ENCOUNTER — Non-Acute Institutional Stay (SKILLED_NURSING_FACILITY): Payer: Medicare Other | Admitting: Adult Health

## 2018-08-12 DIAGNOSIS — F329 Major depressive disorder, single episode, unspecified: Secondary | ICD-10-CM

## 2018-08-12 DIAGNOSIS — I679 Cerebrovascular disease, unspecified: Secondary | ICD-10-CM | POA: Diagnosis not present

## 2018-08-12 DIAGNOSIS — I6322 Cerebral infarction due to unspecified occlusion or stenosis of basilar arteries: Secondary | ICD-10-CM

## 2018-08-12 DIAGNOSIS — N183 Chronic kidney disease, stage 3 unspecified: Secondary | ICD-10-CM

## 2018-08-12 DIAGNOSIS — G8191 Hemiplegia, unspecified affecting right dominant side: Secondary | ICD-10-CM

## 2018-08-12 NOTE — Progress Notes (Signed)
Location:   [facility] Room Number: 217 A Place of Service:  SNF (31)    CODE STATUS: DNR  Allergies  Allergen Reactions  . Celecoxib Swelling  . Pregabalin Other (See Comments)    Dizziness    Chief Complaint  Patient presents with  . Discharge Note    Discharging to ALF on 08/16/2018    HPI:  She is being discharged to assisted living with home health for pt/ot/rn. She will need a light weight wheelchair. She will not need her prescriptions written the receiving facility will provide. She will follow up medically with provider at the receiving facility. She had been hospitalized for multiple acute cva. She had been admitted to this facility for short term rehab. She will be going to assisted living as she cannot maintain her self in independent living.    Past Medical History:  Diagnosis Date  . Anxiety   . Arthritis   . Breast cyst   . Chronic kidney disease    stage III  . Chronic kidney disease, stage III (moderate) (HCC) 12/24/2016   Overview: GFR = 51  . Chronic pain 06/04/2015  . Hypertension   . Lipidemia    mild  . Lumbar back pain 12/24/2016  . Neuropathy 06/04/2015  . Osteoarthritis   . Osteoporosis   . Shingles     Past Surgical History:  Procedure Laterality Date  . Breast biopsy Left   . CHOLECYSTECTOMY    . LAMINECTOMY    . TOTAL HIP ARTHROPLASTY Right 12/20/2017   Procedure: TOTAL HIP ARTHROPLASTY;  Surgeon: Donato HeinzHooten, James P, MD;  Location: [facility] ORS;  Service: Orthopedics;  Laterality: Right;  . TOTAL VAGINAL HYSTERECTOMY      Social History   Socioeconomic History  . Marital status: Married    Spouse name: Not on file  . Number of children: 1  . Years of education: 10312  . Highest education level: High school graduate  Occupational History  . Not on file  Social Needs  . Financial resource strain: Not on file  . Food insecurity:    Worry: Not on file    Inability: Not on file  . Transportation needs:   Medical: Not on file    Non-medical: Not on file  Tobacco Use  . Smoking status: Never Smoker  . Smokeless tobacco: Never Used  Substance and Sexual Activity  . Alcohol use: No    Alcohol/week: 0.0 standard drinks  . Drug use: No  . Sexual activity: Not Currently  Lifestyle  . Physical activity:    Days per week: Not on file    Minutes per session: Not on file  . Stress: Not on file  Relationships  . Social connections:    Talks on phone: Not on file    Gets together: Not on file    Attends religious service: Not on file    Active member of club or organization: Not on file    Attends meetings of clubs or organizations: Not on file    Relationship status: Not on file  . Intimate partner violence:    Fear of current or ex partner: Not on file    Emotionally abused: Not on file    Physically abused: Not on file    Forced sexual activity: Not on file  Other Topics Concern  . Not on file  Social History Narrative  . Not on file   Family History  Problem Relation Age of Onset  .  Diabetes type II Sister   . Heart disease Sister   . CAD Father   . Heart disease Father   . Diabetes type II Mother     VITAL SIGNS BP (!) 148/57   Pulse 80   Temp 98.1 F (36.7 C)   Resp 16   Ht 5\' 4"  (1.626 m)   Wt 205 lb 8 oz (93.2 kg)   SpO2 95%   BMI 35.27 kg/m   Patient's Medications  New Prescriptions   No medications on file  Previous Medications   ACETAMINOPHEN (TYLENOL) 325 MG TABLET    Take 650 mg by mouth every 4 (four) hours as needed. for pain/ increased temp. May be administered orally, per G-tube if needed or rectally if unable to swallow (separate order). Maximum dose for 24 hours is 3,000 mg from all sources of Acetaminophen/ Tylenol   ALPHA-LIPOIC ACID 600 MG CAPS    Take 600 mg by mouth daily.    AMOXICILLIN-CLAVULANATE (AUGMENTIN) 875-125 MG TABLET    Take 1 tablet by mouth 2 (two) times daily.   ASPIRIN 81 MG CHEWABLE TABLET    Chew 81 mg by mouth daily.    ATORVASTATIN (LIPITOR) 80 MG TABLET    Take 80 mg by mouth daily.   B COMPLEX VITAMINS (B COMPLEX 100 PO)    Take 1 tablet by mouth daily   CALCIUM CARB-CHOLECALCIFEROL (CALCIUM/VITAMIN D) 500-200 MG-UNIT TABS    Take 1 tablet by mouth 2 (two) times daily.   CHOLECALCIFEROL (VITAMIN D3) 5000 UNITS TABS    Take 5,000 Units by mouth daily.   COENZYME Q10 100 MG CAPSULE    Take 100 mg by mouth daily.    IPRATROPIUM-ALBUTEROL (DUONEB) 0.5-2.5 (3) MG/3ML SOLN    Take 3 mLs by nebulization every 6 (six) hours.   LIDOCAINE (LIDODERM) 5 %    Apply 1 patch onto skin to the most painful area daily.  Remove & Discard patch within 12 hours or as directed by MD   LINIMENTS (SALONPAS PAIN RELIEF PATCH EX)    Place 1 patch onto the skin daily as needed (FOR PAIN.). apply to affected area   MAGNESIUM OXIDE 200 MG TABS    Take 1 tablet by mouth daily.   METHYLSULFONYLMETHANE (MSM) 1000 MG TABS    Take 3,000 mg by mouth daily.    NON FORMULARY    Diet type:  NAS   OMEGA-3 ACID ETHYL ESTERS (LOVAZA) 1 G CAPSULE    Take 1 g by mouth at bedtime.   PROBIOTIC PRODUCT (RISA-BID PROBIOTIC PO)    Take 1 tablet by mouth 2 (two) times daily.   SERTRALINE (ZOLOFT) 100 MG TABLET    Take 100 mg by mouth daily. for depression status post acute CVA    SODIUM CHLORIDE (OCEAN) 0.65 % SOLN NASAL SPRAY    Place 1 spray into both nostrils daily as needed for congestion.    WARFARIN (COUMADIN) 6 MG TABLET    Take 6 mg by mouth daily.  Modified Medications   No medications on file  Discontinued Medications   No medications on file     SIGNIFICANT DIAGNOSTIC EXAMS   PREVIOUS:   07-18-18: ct of head: Hypoattenuation of the left corona radiata is compatible with a remote lacunar infarction.  07-19-18: 2-d echo with bubble study: EF 55%;   07-20-18: left knee x-ray: Small left knee effusion without evidence of fracture.  07-20-18: MRI/MRA brain: 1. Multiple foci of ischemia within the left superior pons/cerebral  peduncle,  left thalamus and left medial temporal lobe have increased in extent from the recent prior MRI. No acute hemorrhage. 2. New occlusion of the left V3 and V4 segments. The proximal extent of the left vertebral artery occlusion continues into the neck below the field of view on this examination.  3. Persistent occlusion of the left PCA and SCA unchanged from prior CT angiogram.   07-21-18: cervical spine x-ray: Mild degenerative changes of the cervical spine with no evidence of fracture or dynamic instability   07-25-18: MRI brain MRA neck and brain:  IMPRESSION:  1.  New foci of acute infarction in the left superior cerebellum, left thalamus, left midbrain and left hippocampus. 2.  Evolving infarcts in the left midbrain, left occipital lobe and bilateral cerebellum. No acute hemorrhage or significant mass effect. 3.  Interval partial recanalization of the distal basilar artery and left PCA. 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  08-01-18: chest x-ray: 1. Borderline cardiomegaly without overt chf. 2. No acute parenchymal pathology evident.   NO NEW EXAMS.   LABS REVIEWED; PREVIOUS:  07-19-18: chol 232; ldl 157; trig 114; hdl 55 tsh 3.43 hgb a1c 6.0  07-25-18: wbc 8.5; hgb 7.8; hct 26.6; mcv 86; plt 233 glucose 108; bun 26; creat 1.0; k+ 3.9; na++ 142; ca 8.7 INR 2.0  07-27-18: INR 1.85  08-01-18: wbc 14.0; hgb 7.4; hct 24.0; mcv 83.;9 plt 329 INR5.74 08-02-18: INR 6.29 08-04-18: INR 3.58  08-05-18: tsh 2.25 08-11-18: INR 2.36  NO NEW LABS.     Review of Systems  Constitutional: Negative for malaise/fatigue.  Respiratory: Negative for cough and shortness of breath.   Cardiovascular: Negative for chest pain, palpitations and leg swelling.  Gastrointestinal: Negative for abdominal pain, constipation and heartburn.  Musculoskeletal: Negative for back pain, joint pain and myalgias.  Skin: Negative.   Neurological: Negative for dizziness.  Psychiatric/Behavioral:  The patient is not nervous/anxious.      Physical Exam Constitutional:      General: She is not in acute distress.    Appearance: She is well-developed. She is obese. She is not diaphoretic.  Eyes:     Comments: Left eye ptosis; is using a patch to right eye  Has diplopia       Neck:     Thyroid: No thyromegaly.  Cardiovascular:     Rate and Rhythm: Normal rate and regular rhythm.     Pulses: Normal pulses.     Heart sounds: Normal heart sounds. No murmur.  Pulmonary:     Effort: Pulmonary effort is normal. No respiratory distress.     Breath sounds: Normal breath sounds.  Abdominal:     General: Bowel sounds are normal. There is no distension.     Palpations: Abdomen is soft.     Tenderness: There is no abdominal tenderness.  Musculoskeletal:     Right lower leg: No edema.     Left lower leg: No edema.     Comments:  Is able to move all extremities Has mild right side weakness History of right hip replacement in May 2019     Lymphadenopathy:     Cervical: No cervical adenopathy.  Skin:    General: Skin is warm and dry.  Neurological:     Mental Status: She is alert. Mental status is at baseline.  Psychiatric:        Mood and Affect: Mood normal.      ASSESSMENT/ PLAN:   Patient is being discharged with the following  home health services:  Pt/pt/rn: to evaluate and treat as indicated for gait balance strength adl training medication and INR management due 08-25-18   Patient is being discharged with the following durable medical equipment:  Light weight wheelchair with elevated leg rests; cushion; anti-tippers; brake extensions in order for her to maintain her current level of independence which cannot be achieved with as walker; is able to self propel; requires light weight chair due to right arm weakness    Patient has been advised to f/u with their PCP in 1-2 weeks to bring them up to date on their rehab stay.  Social services at facility was responsible for arranging  this appointment.  Pt was provided with a 30 day supply of prescriptions for medications and refills must be obtained from their PCP.  For controlled substances, a more limited supply may be provided adequate until PCP appointment only.  Her medications will be provided at the receiving facility.   Time spent with patient and discharge process: 40 minutes: for medications; home health and dme.    Synthia Innocent NP [facility]  Contact [phone] Monday through Friday 8am- 5pm  After hours call [phone]

## 2018-08-17 ENCOUNTER — Encounter: Payer: Self-pay | Admitting: Adult Health

## 2018-08-17 NOTE — Progress Notes (Signed)
[facility]:   [facility] Room Number: 217 [id number] Place of Service:  SNF (31)   CODE STATUS: DNR  Allergies  Allergen Reactions  . Celecoxib Swelling  . Pregabalin Other (See Comments)    Dizziness    No chief complaint on file.   HPI:    Past Medical History:  Diagnosis Date  . Anxiety   . Arthritis   . Breast cyst   . Chronic kidney disease    stage III  . Chronic kidney disease, stage III (moderate) (HCC) 12/24/2016   Overview: GFR = 51  . Chronic pain 06/04/2015  . Hypertension   . Lipidemia    mild  . Lumbar back pain 12/24/2016  . Neuropathy 06/04/2015  . Osteoarthritis   . Osteoporosis   . Shingles     Past Surgical History:  Procedure Laterality Date  . Breast biopsy Left   . CHOLECYSTECTOMY    . LAMINECTOMY    . TOTAL HIP ARTHROPLASTY Right 12/20/2017   Procedure: TOTAL HIP ARTHROPLASTY;  Surgeon: Donato HeinzHooten, James P, MD;  Location: [facility] ORS;  Service: Orthopedics;  Laterality: Right;  . TOTAL VAGINAL HYSTERECTOMY      Social History   Socioeconomic History  . Marital status: Married    Spouse name: Not on file  . Number of children: 1  . Years of education: 5612  . Highest education level: High school graduate  Occupational History  . Not on file  Social Needs  . Financial resource strain: Not on file  . Food insecurity:    Worry: Not on file    Inability: Not on file  . Transportation needs:    Medical: Not on file    Non-medical: Not on file  Tobacco Use  . Smoking status: Never Smoker  . Smokeless tobacco: Never Used  Substance and Sexual Activity  . Alcohol use: No    Alcohol/week: 0.0 standard drinks  . Drug use: No  . Sexual activity: Not Currently  Lifestyle  . Physical activity:    Days per week: Not on file    Minutes per session: Not on file  . Stress: Not on file  Relationships  . Social connections:    Talks on phone: Not on file    Gets together: Not on file    Attends religious service: Not on  file    Active member of club or organization: Not on file    Attends meetings of clubs or organizations: Not on file    Relationship status: Not on file  . Intimate partner violence:    Fear of current or ex partner: Not on file    Emotionally abused: Not on file    Physically abused: Not on file    Forced sexual activity: Not on file  Other Topics Concern  . Not on file  Social History Narrative  . Not on file   Family History  Problem Relation Age of Onset  . Diabetes type II Sister   . Heart disease Sister   . CAD Father   . Heart disease Father   . Diabetes type II Mother       VITAL SIGNS BP (!) 145/52   Pulse 66   Temp 98.2 F (36.8 C)   Resp 20   Ht 5\' 4"  (1.626 m)   Wt 202 lb 14.4 oz (92 kg)   SpO2 95%   BMI 34.83 kg/m   Outpatient Encounter Medications as of 08/17/2018  Medication Sig  .  acetaminophen (TYLENOL) 325 MG tablet Take 650 mg by mouth every 4 (four) hours as needed. for pain/ increased temp. May be administered orally, per G-tube if needed or rectally if unable to swallow (separate order). Maximum dose for 24 hours is 3,000 mg from all sources of Acetaminophen/ Tylenol  . Alpha-Lipoic Acid 600 MG CAPS Take 600 mg by mouth daily.   Marland Kitchen. aspirin 81 MG chewable tablet Chew 81 mg by mouth daily.  Marland Kitchen. atorvastatin (LIPITOR) 80 MG tablet Take 80 mg by mouth daily.  . B Complex Vitamins (B COMPLEX 100 PO) Take 1 tablet by mouth daily  . Calcium Carb-Cholecalciferol (CALCIUM/VITAMIN D) 500-200 MG-UNIT TABS Take 1 tablet by mouth 2 (two) times daily.  . Cholecalciferol (VITAMIN D3) 5000 units TABS Take 5,000 Units by mouth daily.  . Coenzyme Q10 100 MG capsule Take 100 mg by mouth daily.   Marland Kitchen. ipratropium-albuterol (DUONEB) 0.5-2.5 (3) MG/3ML SOLN Take 3 mLs by nebulization every 6 (six) hours.  . lidocaine (LIDODERM) 5 % Apply 1 patch onto skin to the most painful area daily.  Remove & Discard patch within 12 hours or as directed by MD  . Liniments (SALONPAS PAIN  RELIEF PATCH EX) Place 1 patch onto the skin daily as needed (FOR PAIN.). apply to affected area  . Magnesium Oxide 200 MG TABS Take 1 tablet by mouth daily.  . Methylsulfonylmethane (MSM) 1000 MG TABS Take 3,000 mg by mouth daily.   . NON FORMULARY Diet type:  NAS  . omega-3 acid ethyl esters (LOVAZA) 1 g capsule Take 1 g by mouth at bedtime.  . sertraline (ZOLOFT) 100 MG tablet Take 100 mg by mouth daily. for depression status post acute CVA   . sodium chloride (OCEAN) 0.65 % SOLN nasal spray Place 1 spray into both nostrils daily as needed for congestion.   Marland Kitchen. warfarin (COUMADIN) 6 MG tablet Take 6 mg by mouth daily.   No facility-administered encounter medications on file as of 08/17/2018.      SIGNIFICANT DIAGNOSTIC EXAMS  PREVIOUS:   07-18-18: ct of head: Hypoattenuation of the left corona radiata is compatible with a remote lacunar infarction.  07-19-18: 2-d echo with bubble study: EF 55%;   07-20-18: left knee x-ray: Small left knee effusion without evidence of fracture.  07-20-18: MRI/MRA brain: 1. Multiple foci of ischemia within the left superior pons/cerebral peduncle, left thalamus and left medial temporal lobe have increased in extent from the recent prior MRI. No acute hemorrhage. 2. New occlusion of the left V3 and V4 segments. The proximal extent of the left vertebral artery occlusion continues into the neck below the field of view on this examination.  3. Persistent occlusion of the left PCA and SCA unchanged from prior CT angiogram.   07-21-18: cervical spine x-ray: Mild degenerative changes of the cervical spine with no evidence of fracture or dynamic instability   07-25-18: MRI brain MRA neck and brain:  IMPRESSION:  1.  New foci of acute infarction in the left superior cerebellum, left thalamus, left midbrain and left hippocampus. 2.  Evolving infarcts in the left midbrain, left occipital lobe and bilateral cerebellum. No acute hemorrhage or significant  mass effect. 3.  Interval partial recanalization of the distal basilar artery and left PCA. 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  08-01-18: chest x-ray: 1. Borderline cardiomegaly without overt chf. 2. No acute parenchymal pathology evident.   NO NEW EXAMS.   LABS REVIEWED; PREVIOUS:  07-19-18: chol 232; ldl 157; trig 114;  hdl 55 tsh 3.43 hgb a1c 6.0  07-25-18: wbc 8.5; hgb 7.8; hct 26.6; mcv 86; plt 233 glucose 108; bun 26; creat 1.0; k+ 3.9; na++ 142; ca 8.7 INR 2.0  07-27-18: INR 1.85  08-01-18: wbc 14.0; hgb 7.4; hct 24.0; mcv 83.;9 plt 329 INR5.74 08-02-18: INR 6.29 08-04-18: INR 3.58  08-05-18: tsh 2.25 08-11-18: INR 2.36  NO NEW LABS.     Review of Systems  Constitutional: Negative for malaise/fatigue.  Respiratory: Negative for cough and shortness of breath.   Cardiovascular: Negative for chest pain, palpitations and leg swelling.  Gastrointestinal: Negative for abdominal pain, constipation and heartburn.  Musculoskeletal: Negative for back pain, joint pain and myalgias.  Skin: Negative.   Neurological: Negative for dizziness.  Psychiatric/Behavioral: The patient is not nervous/anxious.      Physical Exam Constitutional:      General: She is not in acute distress.    Appearance: She is well-developed. She is obese. She is not diaphoretic.  Eyes:     Comments: Left eye ptosis; is using a patch to right eye  Has diplopia       Neck:     Thyroid: No thyromegaly.  Cardiovascular:     Rate and Rhythm: Normal rate and regular rhythm.     Pulses: Normal pulses.     Heart sounds: Normal heart sounds. No murmur.  Pulmonary:     Effort: Pulmonary effort is normal. No respiratory distress.     Breath sounds: Normal breath sounds.  Abdominal:     General: Bowel sounds are normal. There is no distension.     Palpations: Abdomen is soft.     Tenderness: There is no abdominal tenderness.  Musculoskeletal:     Right lower leg: No edema.     Left lower leg:  No edema.     Comments:  Is able to move all extremities Has mild right side weakness History of right hip replacement in May 2019     Lymphadenopathy:     Cervical: No cervical adenopathy.  Skin:    General: Skin is warm and dry.  Neurological:     Mental Status: She is alert. Mental status is at baseline.  Psychiatric:        Mood and Affect: Mood normal.      ASSESSMENT/ PLAN:   Patient is being discharged with the following home health services:  Pt/pt/rn: to evaluate and treat as indicated for gait balance strength adl training medication and INR management due 08-25-18   Patient is being discharged with the following durable medical equipment:  Light weight wheelchair with elevated leg rests; cushion; anti-tippers; brake extensions in order for her to maintain her current level of independence which cannot be achieved with as walker; is able to self propel; requires light weight chair due to right arm weakness    Patient has been advised to f/u with their PCP in 1-2 weeks to bring them up to date on their rehab stay.  Social services at facility was responsible for arranging this appointment.  Pt was provided with a 30 day supply of prescriptions for medications and refills must be obtained from their PCP.  For controlled substances, a more limited supply may be provided adequate until PCP appointment only.  Her medications will be provided at the receiving facility.   MD is aware of resident's narcotic use and is in agreement with current plan of care. We will attempt to wean resident as apropriate   Synthia Innocent NP [facility]  Adult Medicine  Contact [phone] Monday through Friday 8am- 5pm  After hours call [phone]

## 2018-08-18 ENCOUNTER — Encounter: Payer: Self-pay | Admitting: Adult Health

## 2018-08-21 NOTE — Progress Notes (Signed)
This encounter was created in error - please disregard.

## 2018-08-22 ENCOUNTER — Telehealth: Payer: Self-pay

## 2018-08-22 NOTE — Telephone Encounter (Signed)
Misty Stanley from [facility] called requesting verbal orders for patient for OT for 2 x a week for 5 weeks. States she wants help her after her recent stroke.  Gave verbal orders OTP.

## 2018-08-26 ENCOUNTER — Other Ambulatory Visit (INDEPENDENT_AMBULATORY_CARE_PROVIDER_SITE_OTHER): Payer: Medicare Other | Admitting: Internal Medicine

## 2018-08-26 DIAGNOSIS — I10 Essential (primary) hypertension: Secondary | ICD-10-CM

## 2018-08-26 DIAGNOSIS — I6322 Cerebral infarction due to unspecified occlusion or stenosis of basilar arteries: Secondary | ICD-10-CM

## 2018-08-26 DIAGNOSIS — G629 Polyneuropathy, unspecified: Secondary | ICD-10-CM

## 2018-08-26 DIAGNOSIS — N183 Chronic kidney disease, stage 3 unspecified: Secondary | ICD-10-CM

## 2018-08-26 DIAGNOSIS — G8191 Hemiplegia, unspecified affecting right dominant side: Secondary | ICD-10-CM

## 2018-08-26 DIAGNOSIS — Z96641 Presence of right artificial hip joint: Secondary | ICD-10-CM

## 2018-08-26 DIAGNOSIS — I679 Cerebrovascular disease, unspecified: Secondary | ICD-10-CM

## 2018-08-26 DIAGNOSIS — Z7901 Long term (current) use of anticoagulants: Secondary | ICD-10-CM

## 2018-08-26 DIAGNOSIS — H02402 Unspecified ptosis of left eyelid: Secondary | ICD-10-CM

## 2018-08-26 DIAGNOSIS — F329 Major depressive disorder, single episode, unspecified: Secondary | ICD-10-CM

## 2018-08-26 NOTE — Progress Notes (Signed)
Received orders from [facility]. Start of care 08/19/2018 through 10/17/2018. Orders are reviewed, signed and faxed.

## 2019-01-26 ENCOUNTER — Encounter: Payer: Medicare Other | Admitting: Internal Medicine

## 2019-05-30 IMAGING — DX DG HIP (WITH OR WITHOUT PELVIS) 1V PORT*R*
2 series · 3 of 3 positions shown · non-contrast
Comparison: None.

CLINICAL DATA: Post op films RT hip

EXAM:
DG HIP (WITH OR WITHOUT PELVIS) 1V PORT RIGHT

[pelvis ap]
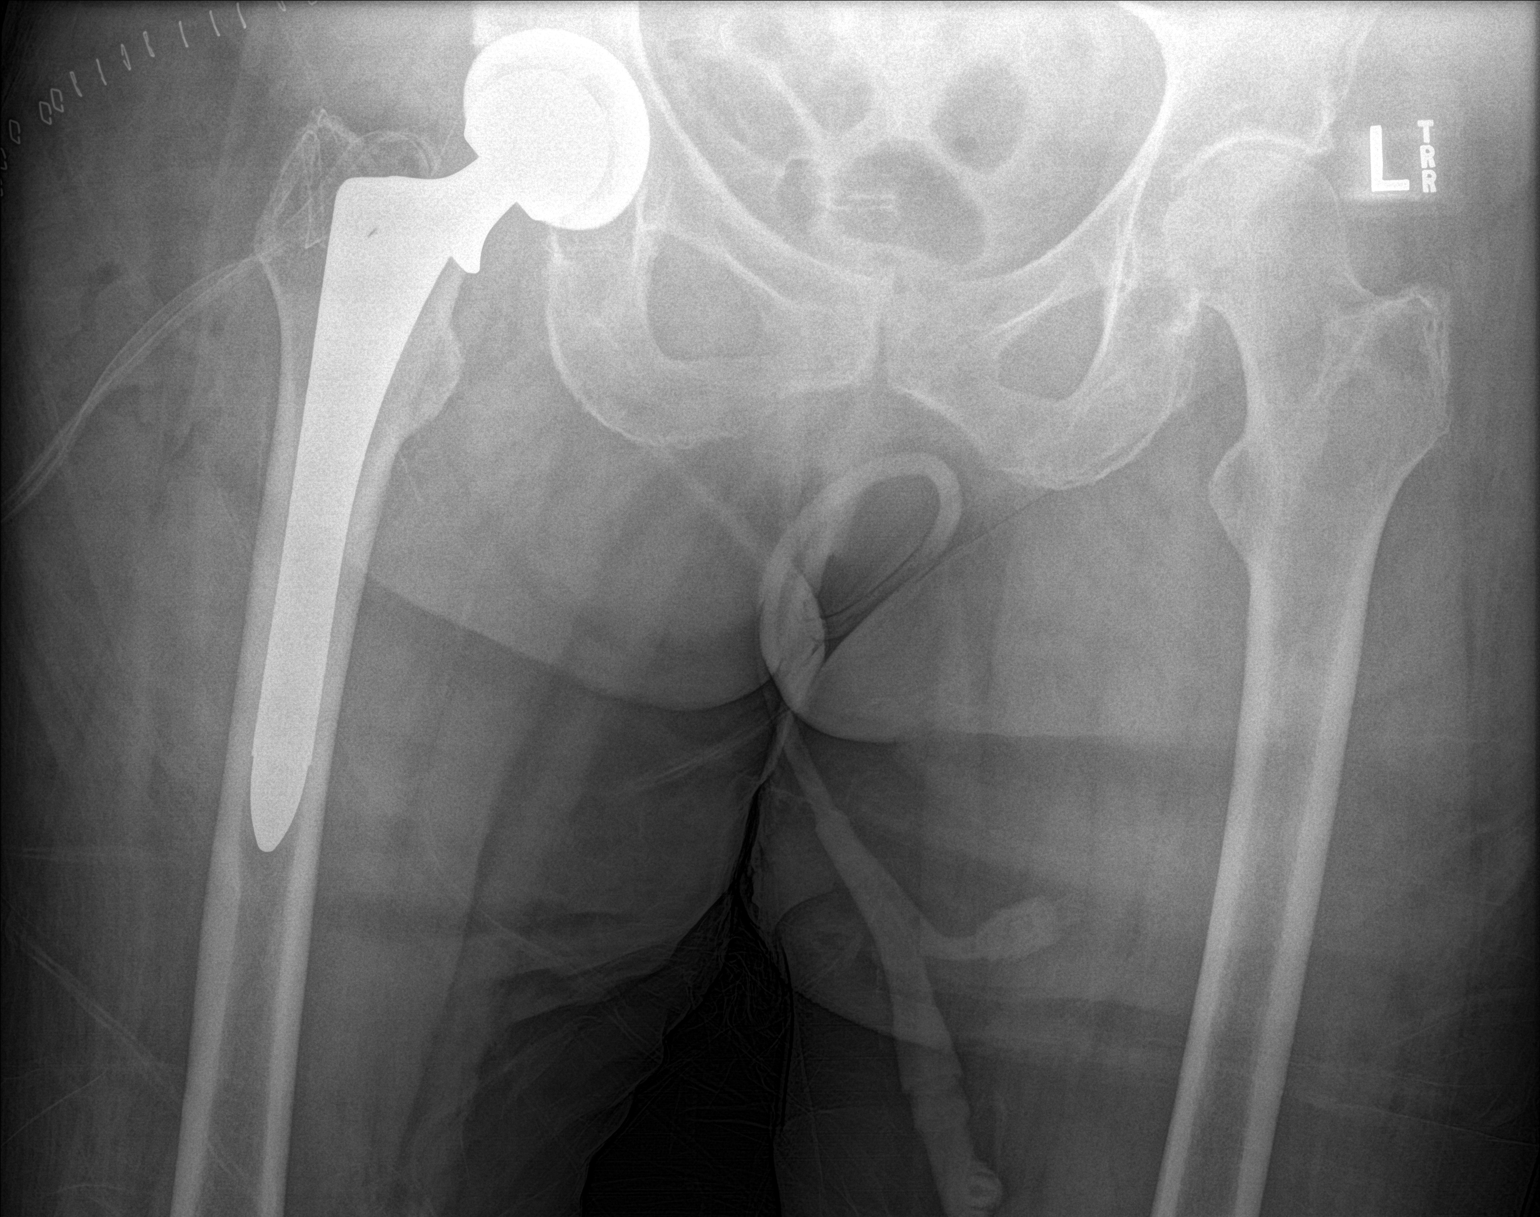

[Series 2: hip lat · 0.14mm/px · 2 of 2 slices shown]
[im 1/2]
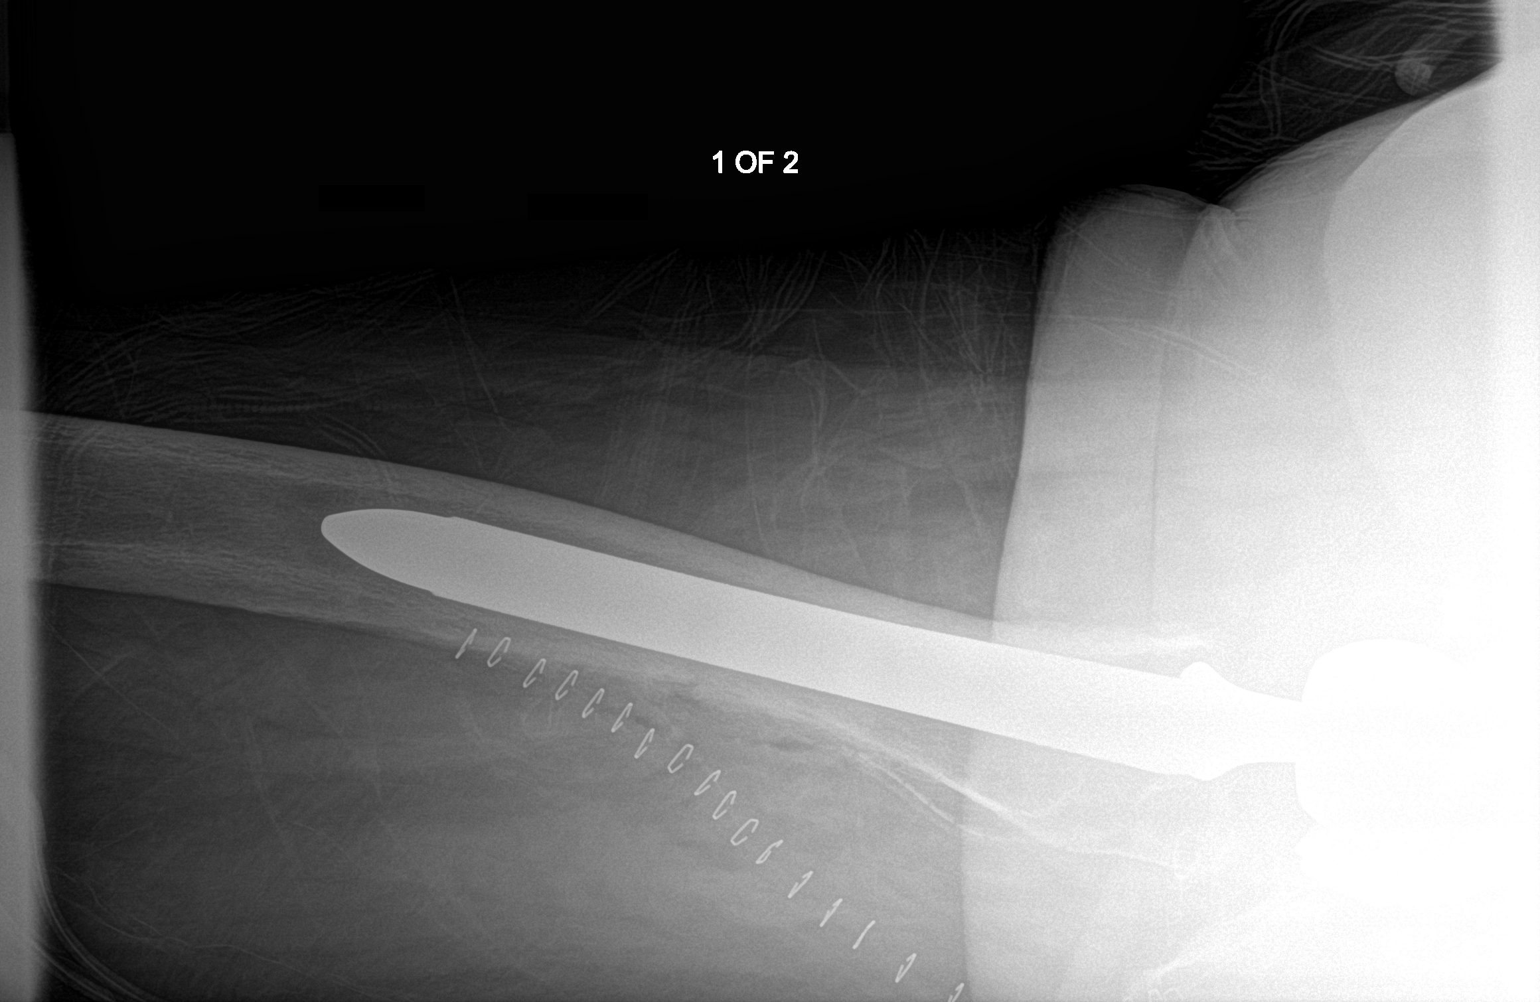
[im 2/2]
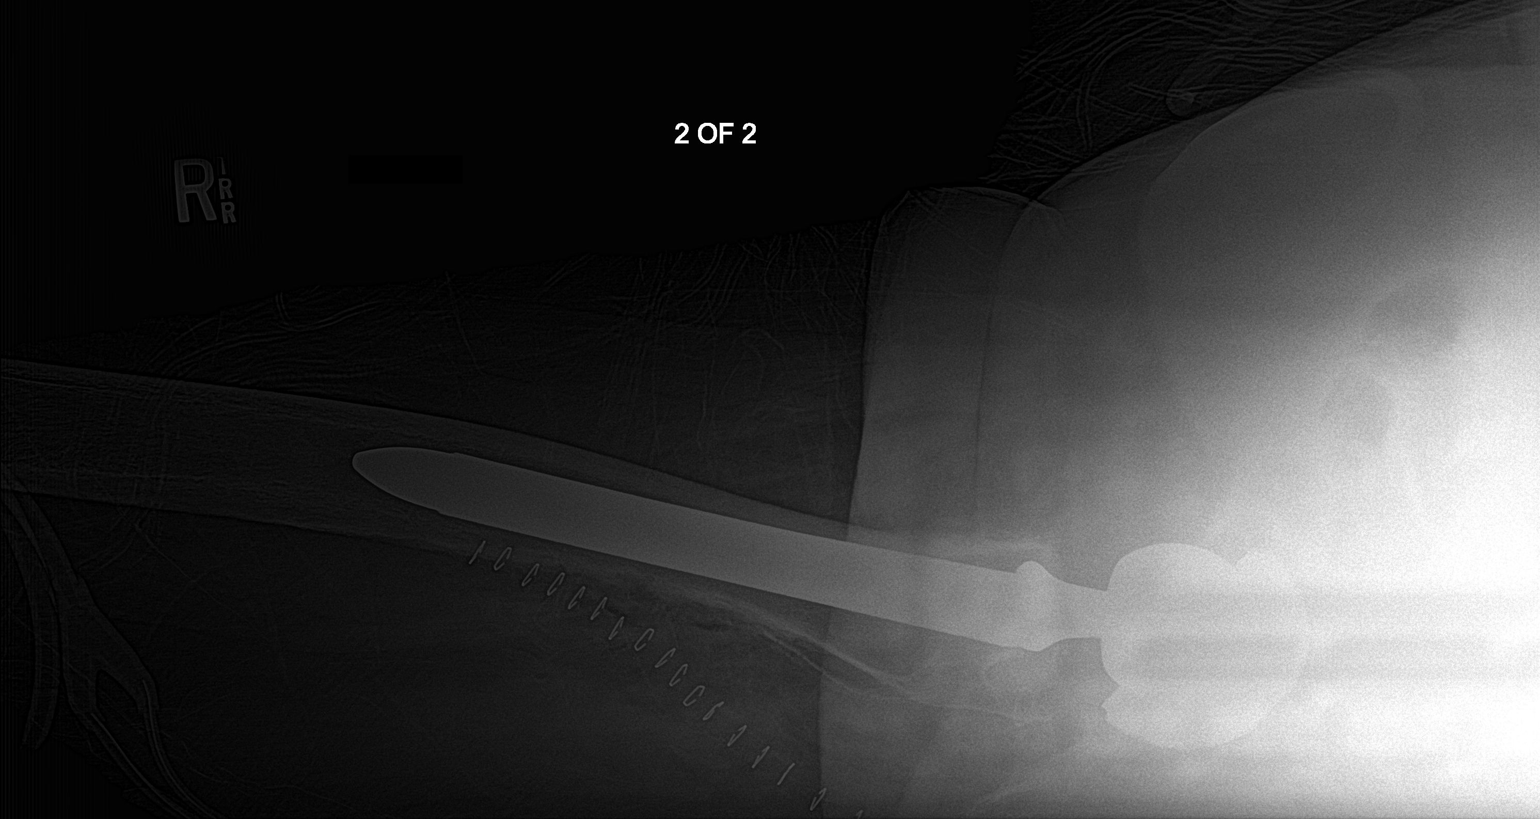

[3 of 3 positions shown; findings below may reference images not displayed]

FINDINGS: RIGHT hip arthroplasty hardware appears intact and appropriately
positioned. Osseous alignment is anatomic. Expected postsurgical
changes within the overlying soft tissues. Drainage catheters appear
appropriately positioned at the RIGHT hip.
IMPRESSION: Status post RIGHT hip arthroplasty. Hardware appears intact and
appropriately positioned. No evidence of surgical complicating
feature.

## 2020-03-06 ENCOUNTER — Ambulatory Visit: Payer: Medicare Other | Admitting: Dermatology
